# Patient Record
Sex: Female | Born: 1940 | Race: White | Hispanic: Yes | Marital: Married | Smoking: Never smoker
Health system: Southern US, Community
[De-identification: ages and names within clinical notes are randomized; demographics above are authoritative.]

## PROBLEM LIST (undated history)

## (undated) DIAGNOSIS — I4891 Unspecified atrial fibrillation: Secondary | ICD-10-CM

## (undated) DIAGNOSIS — I10 Essential (primary) hypertension: Secondary | ICD-10-CM

## (undated) DIAGNOSIS — E7800 Pure hypercholesterolemia, unspecified: Secondary | ICD-10-CM

## (undated) DIAGNOSIS — E119 Type 2 diabetes mellitus without complications: Secondary | ICD-10-CM

---

## 2007-12-06 ENCOUNTER — Emergency Department (HOSPITAL_BASED_OUTPATIENT_CLINIC_OR_DEPARTMENT_OTHER): Admission: EM | Admit: 2007-12-06 | Discharge: 2007-12-06 | Payer: Self-pay | Admitting: Emergency Medicine

## 2013-11-20 ENCOUNTER — Ambulatory Visit: Payer: Self-pay | Admitting: Gynecology

## 2016-08-23 ENCOUNTER — Encounter: Payer: Self-pay | Admitting: Gynecology

## 2017-02-07 ENCOUNTER — Encounter (HOSPITAL_BASED_OUTPATIENT_CLINIC_OR_DEPARTMENT_OTHER): Payer: Self-pay

## 2017-02-07 ENCOUNTER — Emergency Department (HOSPITAL_BASED_OUTPATIENT_CLINIC_OR_DEPARTMENT_OTHER)
Admission: EM | Admit: 2017-02-07 | Discharge: 2017-02-07 | Disposition: A | Discharge status: Home / Self Care | Payer: Medicare Other | Attending: Emergency Medicine | Admitting: Emergency Medicine

## 2017-02-07 ENCOUNTER — Emergency Department (HOSPITAL_BASED_OUTPATIENT_CLINIC_OR_DEPARTMENT_OTHER): Payer: Medicare Other

## 2017-02-07 DIAGNOSIS — I10 Essential (primary) hypertension: Secondary | ICD-10-CM | POA: Diagnosis not present

## 2017-02-07 DIAGNOSIS — E7800 Pure hypercholesterolemia, unspecified: Secondary | ICD-10-CM | POA: Insufficient documentation

## 2017-02-07 DIAGNOSIS — N3000 Acute cystitis without hematuria: Secondary | ICD-10-CM | POA: Insufficient documentation

## 2017-02-07 DIAGNOSIS — R51 Headache: Secondary | ICD-10-CM | POA: Diagnosis present

## 2017-02-07 DIAGNOSIS — E119 Type 2 diabetes mellitus without complications: Secondary | ICD-10-CM | POA: Diagnosis not present

## 2017-02-07 DIAGNOSIS — R519 Headache, unspecified: Secondary | ICD-10-CM

## 2017-02-07 HISTORY — DX: Type 2 diabetes mellitus without complications: E11.9

## 2017-02-07 HISTORY — DX: Essential (primary) hypertension: I10

## 2017-02-07 HISTORY — DX: Pure hypercholesterolemia, unspecified: E78.00

## 2017-02-07 LAB — CBC WITH DIFFERENTIAL/PLATELET
Basophils Absolute: 0 10*3/uL (ref 0.0–0.1)
Basophils Relative: 1 %
EOS ABS: 0.3 10*3/uL (ref 0.0–0.7)
EOS PCT: 4 %
HCT: 39.3 % (ref 36.0–46.0)
Hemoglobin: 13.4 g/dL (ref 12.0–15.0)
LYMPHS ABS: 2.1 10*3/uL (ref 0.7–4.0)
Lymphocytes Relative: 27 %
MCH: 28.4 pg (ref 26.0–34.0)
MCHC: 34.1 g/dL (ref 30.0–36.0)
MCV: 83.3 fL (ref 78.0–100.0)
MONOS PCT: 6 %
Monocytes Absolute: 0.5 10*3/uL (ref 0.1–1.0)
Neutro Abs: 5.1 10*3/uL (ref 1.7–7.7)
Neutrophils Relative %: 62 %
PLATELETS: 351 10*3/uL (ref 150–400)
RBC: 4.72 MIL/uL (ref 3.87–5.11)
RDW: 12.8 % (ref 11.5–15.5)
WBC: 8.1 10*3/uL (ref 4.0–10.5)

## 2017-02-07 LAB — URINALYSIS, ROUTINE W REFLEX MICROSCOPIC
Bilirubin Urine: NEGATIVE
Glucose, UA: NEGATIVE mg/dL
Hgb urine dipstick: NEGATIVE
Ketones, ur: NEGATIVE mg/dL
NITRITE: NEGATIVE
PROTEIN: NEGATIVE mg/dL
SPECIFIC GRAVITY, URINE: 1.01 (ref 1.005–1.030)
pH: 6 (ref 5.0–8.0)

## 2017-02-07 LAB — COMPREHENSIVE METABOLIC PANEL
ALT: 29 U/L (ref 14–54)
ANION GAP: 10 (ref 5–15)
AST: 35 U/L (ref 15–41)
Albumin: 4.2 g/dL (ref 3.5–5.0)
Alkaline Phosphatase: 67 U/L (ref 38–126)
BUN: 13 mg/dL (ref 6–20)
CHLORIDE: 103 mmol/L (ref 101–111)
CO2: 23 mmol/L (ref 22–32)
Calcium: 9.5 mg/dL (ref 8.9–10.3)
Creatinine, Ser: 0.79 mg/dL (ref 0.44–1.00)
GFR calc non Af Amer: >60 mL/min (ref >60)
Glucose, Bld: 154 mg/dL — ABNORMAL HIGH (ref 65–99)
POTASSIUM: 3.9 mmol/L (ref 3.5–5.1)
SODIUM: 136 mmol/L (ref 135–145)
Total Bilirubin: 0.5 mg/dL (ref 0.3–1.2)
Total Protein: 7.6 g/dL (ref 6.5–8.1)

## 2017-02-07 LAB — URINALYSIS, MICROSCOPIC (REFLEX)

## 2017-02-07 LAB — TROPONIN I: Troponin I: <0.03 ng/mL (ref <0.03)

## 2017-02-07 MED ORDER — ACETAMINOPHEN 500 MG PO TABS
1000.0000 mg | ORAL_TABLET | Freq: Once | ORAL | Status: AC
  Administered 2017-02-07: 1000 mg via ORAL
  Filled 2017-02-07: qty 2

## 2017-02-07 MED ORDER — ONDANSETRON HCL 4 MG/2ML IJ SOLN
4.0000 mg | Freq: Once | INTRAMUSCULAR | Status: AC
  Administered 2017-02-07: 4 mg via INTRAVENOUS
  Filled 2017-02-07: qty 2

## 2017-02-07 MED ORDER — ONDANSETRON 4 MG PO TBDP: 4.0000 mg | ORAL_TABLET | Freq: Three times a day (TID) | ORAL | 0 refills | Status: DC | PRN

## 2017-02-07 MED ORDER — SODIUM CHLORIDE 0.9 % IV BOLUS (SEPSIS)
500.0000 mL | Freq: Once | INTRAVENOUS | Status: AC
  Administered 2017-02-07: 500 mL via INTRAVENOUS

## 2017-02-07 NOTE — ED Provider Notes (Signed)
TIME SEEN: 2:36 AM  CHIEF COMPLAINT: Headache, nausea  HPI: Patient is a 76 year old female with history of hypertension, diabetes, hyperlipidemia, atrial fibrillation on Eliquis who presents emergency department with diffuse frontal throbbing headache that started several days ago.  Has had associated nausea but no vomiting.  States she was seen by her primary care physician on October 25 and was found to be hypertensive.  Has a follow-up appointment today at 1 PM.  She is on amlodipine, losartan and sotalol and denies any changes in these medications.  She has been on these medications for "years".  Has not missed any doses.  She states she has had similar headaches before and that Tylenol normally helps these headaches.  She has not tried any Tylenol today.  She states she has had increased anxiety and stress recently.  Denies head injury.  No numbness, tingling or focal weakness.  No vision changes.  No chest pain or shortness of breath.  No abdominal pain.  No vomiting.  Has "little bit" of diarrhea.  Spanish interpreter used throughout this visit.  ROS: See HPI Constitutional: no fever  Eyes: no drainage  ENT: no runny nose   Cardiovascular:  no chest pain  Resp: no SOB  GI: no vomiting GU: no dysuria Integumentary: no rash  Allergy: no hives  Musculoskeletal: no leg swelling  Neurological: no slurred speech ROS otherwise negative  PAST MEDICAL HISTORY/PAST SURGICAL HISTORY:  Past Medical History:  Diagnosis Date  . Diabetes mellitus without complication (HCC)   . High cholesterol   . Hypertension     MEDICATIONS:  Prior to Admission medications   Not on File    ALLERGIES:  Allergies  Allergen Reactions  . Aspirin   . Lisinopril     SOCIAL HISTORY:  Social History  Substance Use Topics  . Smoking status: Never Smoker  . Smokeless tobacco: Never Used  . Alcohol use No    FAMILY HISTORY: No family history on file.  EXAM: BP (!) 171/75 (BP Location: Right Arm)    Pulse 81   Temp 98.1 F (36.7 C) (Oral)   Ht 5' (1.524 m)   Wt 61.2 kg (135 lb)   SpO2 98%   BMI 26.37 kg/m  CONSTITUTIONAL: Alert and oriented and responds appropriately to questions. Well-appearing; well-nourished HEAD: Normocephalic EYES: Conjunctivae clear, pupils appear equal, EOMI ENT: normal nose; moist mucous membranes NECK: Supple, no meningismus, no nuchal rigidity, no LAD  CARD: RRR; S1 and S2 appreciated; no murmurs, no clicks, no rubs, no gallops RESP: Normal chest excursion without splinting or tachypnea; breath sounds clear and equal bilaterally; no wheezes, no rhonchi, no rales, no hypoxia or respiratory distress, speaking full sentences ABD/GI: Normal bowel sounds; non-distended; soft, non-tender, no rebound, no guarding, no peritoneal signs, no hepatosplenomegaly BACK:  The back appears normal and is non-tender to palpation, there is no CVA tenderness EXT: Normal ROM in all joints; non-tender to palpation; no edema; normal capillary refill; no cyanosis, no calf tenderness or swelling    SKIN: Normal color for age and race; warm; no rash NEURO: Moves all extremities equally; Strength 5/5 in all four extremities.  Normal sensation diffusely.  CN 2-12 grossly intact. Normal speech.  Normal gait. PSYCH: The patient's mood and manner are appropriate. Grooming and personal hygiene are appropriate.  MEDICAL DECISION MAKING: Patient here with complaints of headache and nausea.  She is also hypertensive but states that she was aware of this and has follow-up for this at 1 PM today  with her primary care doctor.  She states that she began feeling anxious today and then her blood pressure went up blood pressure normally is in the 140s/70s.  Check her blood pressure every day.  No focal neurologic deficits.  No severe, sudden onset headache.  Has had similar headaches in the past.  Reports Tylenol has helped before.  Will give dose of Tylenol here as well as small amount of IV fluids  and IV Zofran.  Abdominal exam is benign.  She does report that she was told by her primary care doctor that she had a urinary tract infection but has not started the antibiotics yet.  Discussed with her that this could be the reason she is feeling poorly and feeling nauseated.  No flank pain.  Will check labs, urine, head CT today.  EKG shows no ischemic abnormality.  ED PROGRESS: Patient's labs are unremarkable.  Normal kidney function.  Urine shows no proteinuria or hematuria.  She does appear to have a urinary tract infection.  Urine culture has been sent.  She will pick up her antibiotics in the morning that was prescribed by her PCP.  Troponin negative.  Patient's blood pressure has improved.  HA and nausea have resolved.  Head CT shows no acute abnormality.  I feel she is safe for discharge.  She has PCP follow-up at 1 PM today.  Recommended Tylenol as needed at home for her headache.  Will discharge with Zofran.   At this time, I do not feel there is any life-threatening condition present. I have reviewed and discussed all results (EKG, imaging, lab, urine as appropriate) and exam findings with patient/family. I have reviewed nursing notes and appropriate previous records.  I feel the patient is safe to be discharged home without further emergent workup and can continue workup as an outpatient as needed. Discussed usual and customary return precautions. Patient/family verbalize understanding and are comfortable with this plan.  Outpatient follow-up has been provided if needed. All questions have been answered.    EKG Interpretation  Date/Time:  Wednesday February 07 2017 03:15:00 EDT Ventricular Rate:  71 PR Interval:    QRS Duration: 104 QT Interval:  459 QTC Calculation: 499 R Axis:   37 Text Interpretation:  Sinus rhythm Atrial premature complex Low voltage, precordial leads Abnormal R-wave progression, early transition Borderline prolonged QT interval No old tracing to compare Confirmed  by Bane Hagy, Baxter Hire [phone]) on 02/07/2017 3:25:53 AM          Satori Krabill, Layla Maw, DO 02/07/17 [phone]

## 2017-02-07 NOTE — ED Triage Notes (Signed)
Pt reports high BP x 3 days. Pt complains of nausea, lightheadedness and HA.

## 2017-02-07 NOTE — Discharge Instructions (Addendum)
You may take Tylenol 1000 mg every 6 hours as needed for pain.  Please pick up your antibiotics in the morning and start taking these for your urinary tract infection.  Your labs, EKG and head CT were normal today.  Your blood pressure improved as your headache and nausea resolved.  Please continue your medications as prescribed.  Please follow-up with your primary care physician as scheduled at 1 PM today.

## 2017-02-08 LAB — URINE CULTURE

## 2017-02-09 ENCOUNTER — Telehealth: Payer: Self-pay | Admitting: *Deleted

## 2017-02-09 NOTE — Telephone Encounter (Signed)
Post ED Visit - Positive Culture Follow-up  Culture report reviewed by antimicrobial stewardship pharmacist:  [x]  Savannah Kramer, Pharm.D. []  Savannah Kramer, Pharm.D., BCPS AQ-ID []  Savannah Kramer, Pharm.D., BCPS []  Savannah Kramer, Pharm.D., BCPS []  [name], [address] BoulevardPharm.D., BCPS, AAHIVP []  [name], Pharm.D., BCPS, AAHIVP []  [name], PharmD, BCPS []  Savannah Kramer, PharmD, BCPS []  [name], PharmD, BCPS  Positive urine culture Treated with Nitrofurantoin by PCP, organism sensitive to the same and no further patient follow-up is required at this time.  Savannah Kramer, [facility] Kramer [facility] 02/09/2017, 9:59 AM

## 2018-05-16 IMAGING — CT CT HEAD W/O CM
3 series · 14 of 47 positions shown, 16 images · non-contrast
Comparison: None.

CLINICAL DATA: Headache, acute. Normal neurologic exam.
Hypertension tonight. Lightheaded.

EXAM:
CT HEAD WITHOUT CONTRAST
TECHNIQUE: Contiguous axial images were obtained from the base of the skull
through the vertex without intravenous contrast.

[Series 2: head wo · axial · 0.41mm/px · z∈[-179,-54]mm · 8 of 30 slices shown, 10 images]
[im 3/30  brain]
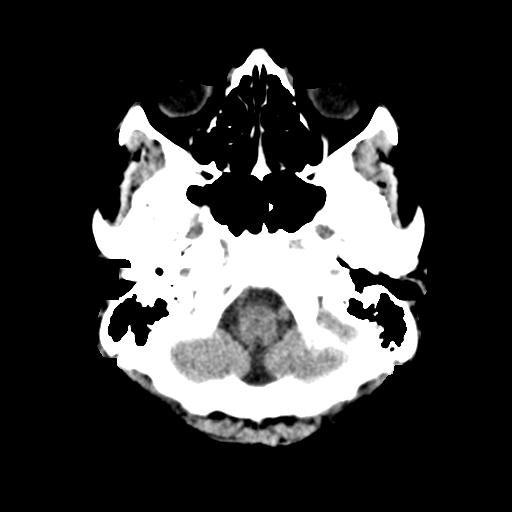
[im 3/30  bone]
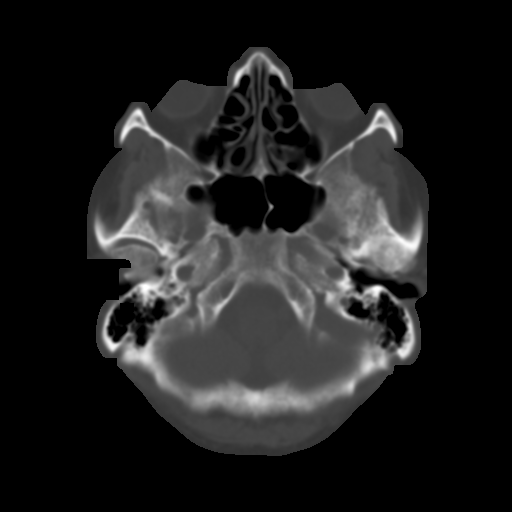
[im 7/30  brain]
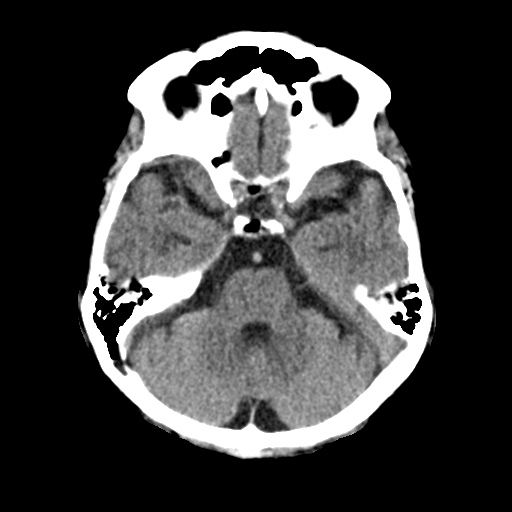
[im 10/30  brain]
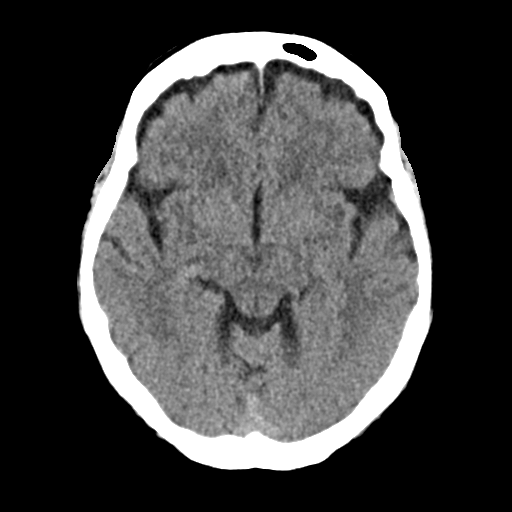
[im 14/30  brain]
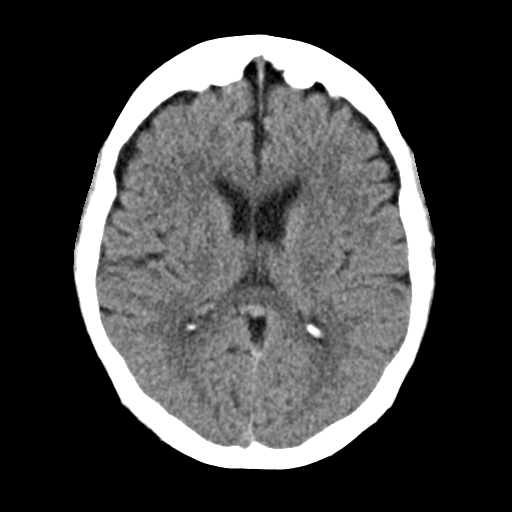
[im 17/30  brain]
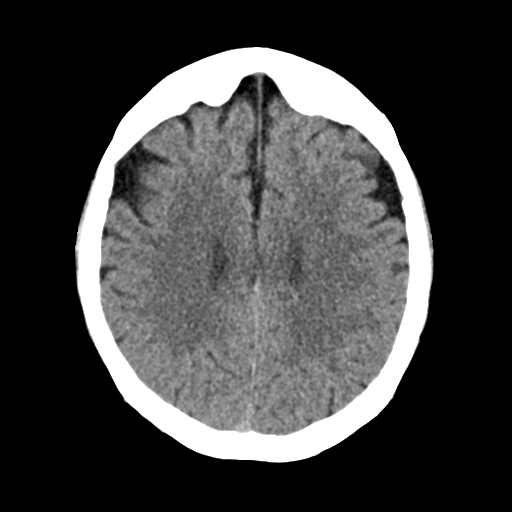
[im 17/30  bone]
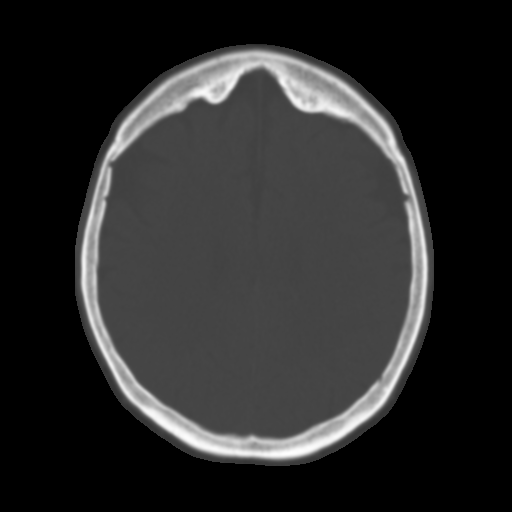
[im 21/30  brain]
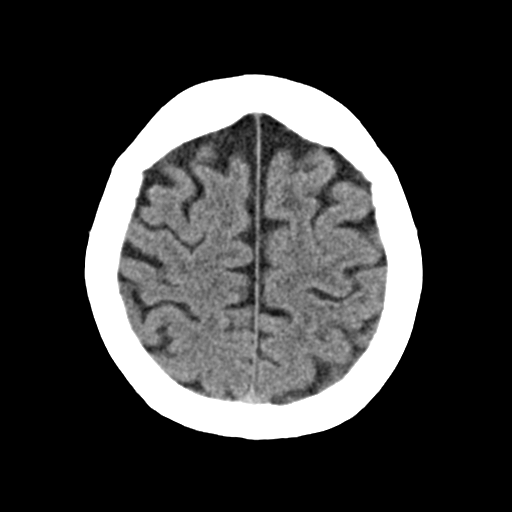
[im 24/30  brain]
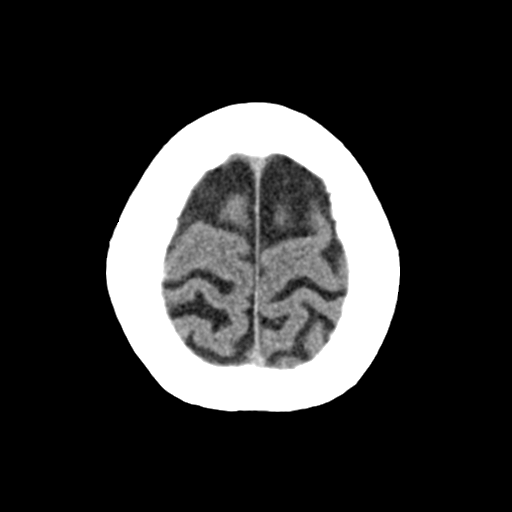
[im 28/30  brain]
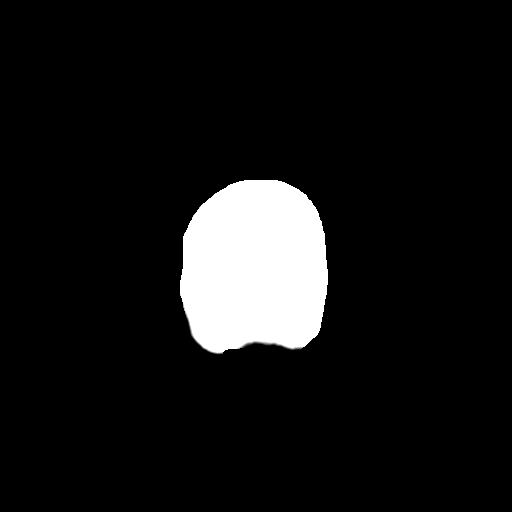

[Series 4: coronal soft · coronal · 0.31mm/px · 3 of 63 slices shown]
[im 21/63  brain]
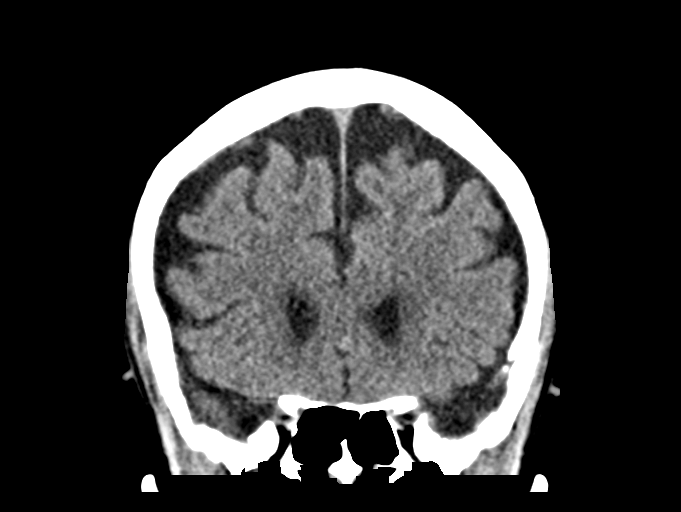
[im 28/63  brain]
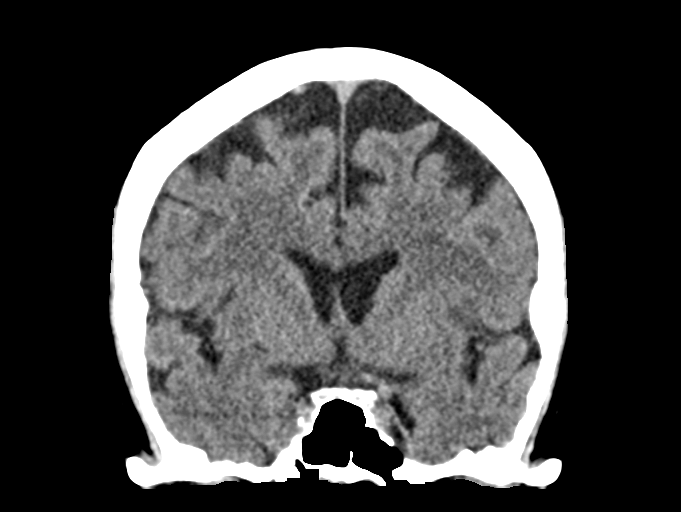
[im 35/63  brain]
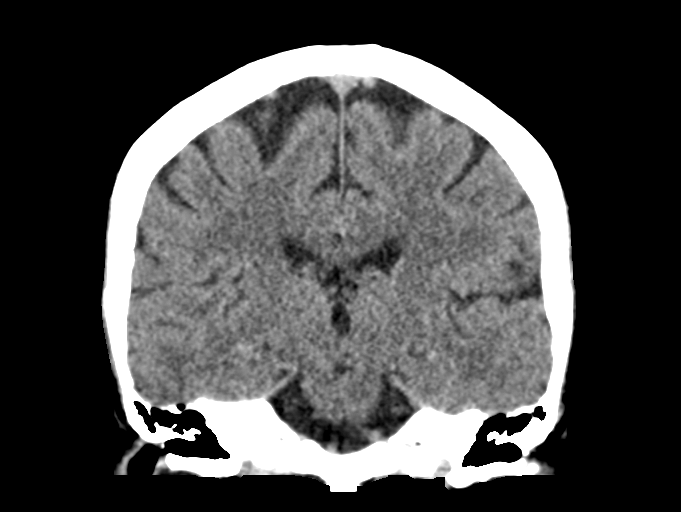

[Series 5: sag soft · sagittal · 0.31mm/px · 3 of 51 slices shown]
[im 17/51  brain]
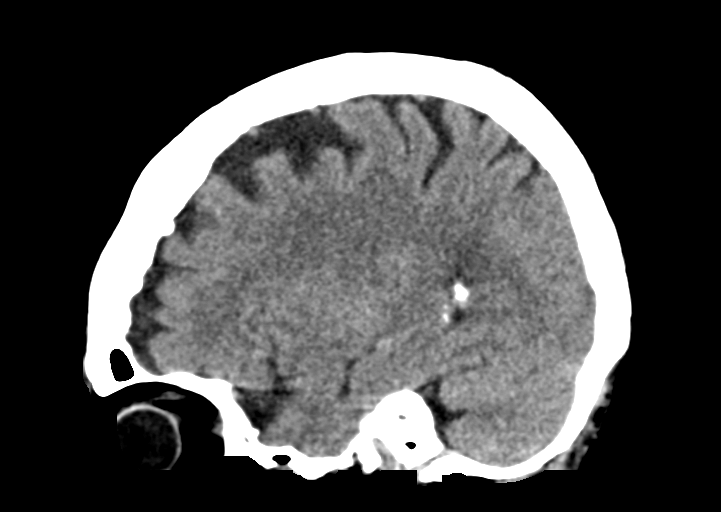
[im 26/51  brain]
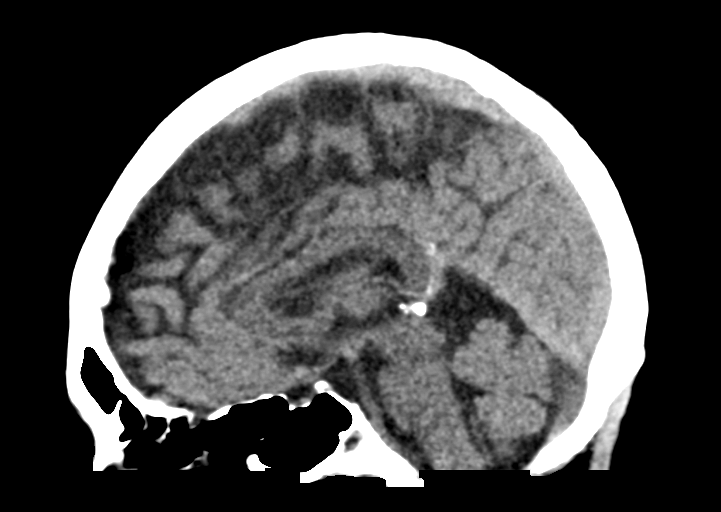
[im 34/51  brain]
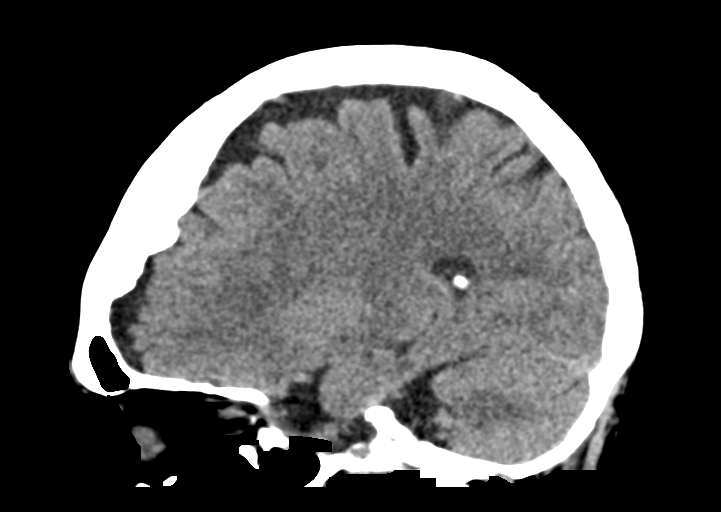

[14 of 47 positions shown; findings below may reference images not displayed]

FINDINGS: Brain: Generalized cerebellar and cerebral atrophy. Mild chronic
small vessel ischemia. No intracranial hemorrhage, mass effect, or
midline shift. No hydrocephalus. The basilar cisterns are patent. No
evidence of territorial infarct or acute ischemia. No extra-axial or
intracranial fluid collection.

Vascular: No hyperdense vessel or unexpected calcification.

Skull: No fracture or focal lesion.

Sinuses/Orbits: Paranasal sinuses and mastoid air cells are clear.
The visualized orbits are unremarkable. Probable bilateral cataract
resection.

Other: None.
IMPRESSION: 1.  No acute intracranial abnormality.
2. Generalized atrophy with mild chronic small vessel ischemia.

## 2022-08-09 ENCOUNTER — Ambulatory Visit (HOSPITAL_COMMUNITY)
Admission: EM | Admit: 2022-08-09 | Discharge: 2022-08-09 | Disposition: A | Discharge status: Home / Self Care | Payer: Medicare HMO | Attending: Emergency Medicine | Admitting: Emergency Medicine

## 2022-08-09 ENCOUNTER — Encounter (HOSPITAL_COMMUNITY): Payer: Self-pay

## 2022-08-09 ENCOUNTER — Ambulatory Visit (INDEPENDENT_AMBULATORY_CARE_PROVIDER_SITE_OTHER): Payer: Medicare HMO

## 2022-08-09 DIAGNOSIS — J40 Bronchitis, not specified as acute or chronic: Secondary | ICD-10-CM

## 2022-08-09 DIAGNOSIS — R051 Acute cough: Secondary | ICD-10-CM

## 2022-08-09 HISTORY — DX: Unspecified atrial fibrillation: I48.91

## 2022-08-09 MED ORDER — PSEUDOEPHEDRINE-GUAIFENESIN 30-100 MG/5ML PO SYRP: 5.0000 mL | ORAL_SOLUTION | ORAL | 0 refills | Status: AC | PRN

## 2022-08-09 MED ORDER — ALBUTEROL SULFATE HFA 108 (90 BASE) MCG/ACT IN AERS: 1.0000 | INHALATION_SPRAY | Freq: Four times a day (QID) | RESPIRATORY_TRACT | 0 refills | Status: DC | PRN

## 2022-08-09 NOTE — ED Triage Notes (Signed)
Pt states had COVID 2wks ago with cough and chest tightness. States sx's have worsen in the past 3 days. States having some SOB when the tightness is bad.

## 2022-08-09 NOTE — ED Provider Notes (Signed)
[facility]    CSN: [id number] Arrival date & time: 08/09/22  1847      History   Chief Complaint Chief Complaint  Patient presents with   Cough    HPI Savannah Kramer is a 82 y.o. female.   Patient brought to clinic by family for complaints of continued cough, chest tightness and shortness of breath after initial COVID-19 infection 2 weeks ago.  She was going to be placed on the antiviral, however, this was too expensive.  She reports slight generalized weakness and overall diminished appetite.  Denies any wheezing.  Denies fevers.  Denies nausea, vomiting or diarrhea.  She is diabetic with hx of HLD, HTN and a-fib on eliquis.     The history is provided by the patient, medical records, a caregiver and a relative.  Cough Associated symptoms: shortness of breath   Associated symptoms: no chest pain, no chills, no sore throat and no wheezing     Past Medical History:  Diagnosis Date   A-fib (HCC)    Diabetes mellitus without complication (HCC)    High cholesterol    Hypertension     There are no problems to display for this patient.   History reviewed. No pertinent surgical history.  OB History   No obstetric history on file.      Home Medications    Prior to Admission medications   Medication Sig Start Date End Date Taking? Authorizing Provider  albuterol (VENTOLIN HFA) 108 (90 Base) MCG/ACT inhaler Inhale 1-2 puffs into the lungs every 6 (six) hours as needed for wheezing or shortness of breath. 08/09/22  Yes Rinaldo Ratel, [place] N, FNP  pseudoephedrine-guaifenesin (TUSSIN PE) 30-100 MG/5ML SYRP Take 5 mLs by mouth every 4 (four) hours as needed for up to 5 days. 08/09/22 08/14/22 Yes Andre Gallego, [place] N, FNP  amLODipine-atorvastatin (CADUET) 5-10 MG tablet Take 1 tablet by mouth daily.    [provider]  apixaban (ELIQUIS) 5 MG TABS tablet Take 5 mg by mouth 2 (two) times daily.    [provider]  calcium carbonate (OSCAL) 1500 (600 Ca) MG  TABS tablet Take by mouth 2 (two) times daily with a meal.    [provider]  cholecalciferol (VITAMIN D) 1000 units tablet Take 1,000 Units by mouth daily.    [provider]  metFORMIN (GLUCOPHAGE) 500 MG tablet Take by mouth 2 (two) times daily with a meal.    [provider]  omeprazole (PRILOSEC OTC) 20 MG tablet Take 20 mg by mouth daily.    [provider]  ondansetron (ZOFRAN ODT) 4 MG disintegrating tablet Take 1 tablet (4 mg total) by mouth every 8 (eight) hours as needed for nausea or vomiting. 02/07/17   Ward, Layla Maw, DO  pravastatin (PRAVACHOL) 80 MG tablet Take 80 mg by mouth daily.    [provider]  sotalol (BETAPACE) 80 MG tablet Take 80 mg by mouth 2 (two) times daily.    [provider]    Family History History reviewed. No pertinent family history.  Social History Social History   Tobacco Use   Smoking status: Never   Smokeless tobacco: Never  Substance Use Topics   Alcohol use: No   Drug use: No     Allergies   Aspirin and Lisinopril   Review of Systems Review of Systems  Constitutional:  Positive for appetite change. Negative for chills.  HENT:  Negative for sore throat.   Respiratory:  Positive for cough and shortness  of breath. Negative for wheezing.   Cardiovascular:  Negative for chest pain.  Gastrointestinal:  Negative for abdominal pain.  Genitourinary:  Negative for dysuria.  Musculoskeletal:  Negative for back pain.  Neurological:  Positive for weakness.     Physical Exam Triage Vital Signs ED Triage Vitals [08/09/22 1925]  Enc Vitals Group     BP (!) 172/76     Pulse Rate 80     Resp 18     Temp 98.3 F (36.8 C)     Temp Source Oral     SpO2 97 %     Weight      Height      Head Circumference      Peak Flow      Pain Score 0     Pain Loc      Pain Edu?      Excl. in GC?    No data found.  Updated Vital Signs BP (!) 172/76 (BP Location: Left Arm)   Pulse 80    Temp 98.3 F (36.8 C) (Oral)   Resp 18   SpO2 97%   Visual Acuity Right Eye Distance:   Left Eye Distance:   Bilateral Distance:    Right Eye Near:   Left Eye Near:    Bilateral Near:     Physical Exam Vitals and nursing note reviewed.  Constitutional:      Appearance: Normal appearance.  HENT:     Head: Normocephalic and atraumatic.     Right Ear: External ear normal.     Left Ear: External ear normal.     Nose: Nose normal.     Mouth/Throat:     Mouth: Mucous membranes are dry.  Eyes:     General: No scleral icterus.    Conjunctiva/sclera: Conjunctivae normal.  Cardiovascular:     Rate and Rhythm: Normal rate and regular rhythm.     Heart sounds: Normal heart sounds. No murmur heard. Pulmonary:     Effort: Pulmonary effort is normal. No respiratory distress.     Breath sounds: Normal breath sounds.  Musculoskeletal:        General: No swelling. Normal range of motion.  Skin:    General: Skin is warm and dry.     Capillary Refill: Capillary refill takes less than 2 seconds.  Neurological:     General: No focal deficit present.     Mental Status: She is alert and oriented to person, place, and time.  Psychiatric:        Mood and Affect: Mood normal.        Behavior: Behavior normal.      UC Treatments / Results  Labs (all labs ordered are listed, but only abnormal results are displayed) Labs Reviewed - No data to display  EKG   Radiology DG Chest 2 View  Result Date: 08/09/2022 CLINICAL DATA:  Cough.  Shortness of breath. EXAM: CHEST - 2 VIEW COMPARISON:  08/07/2017 FINDINGS: The heart size and mediastinal contours are within normal limits. Aortic atherosclerotic calcification incidentally noted. Both lungs are clear. The visualized skeletal structures are unremarkable. IMPRESSION: No active cardiopulmonary disease. Electronically Signed   By: Danae Orleans M.D.   On: 08/09/2022 20:10    Procedures Procedures (including critical care time)  Medications  Ordered in UC Medications - No data to display  Initial Impression / Assessment and Plan / UC Course  I have reviewed the triage vital signs and the nursing notes.  Pertinent labs & imaging  results that were available during my care of the patient were reviewed by me and considered in my medical decision making (see chart for details).  Vitals and triage reviewed, patient is hemodynamically stable.  Lungs vesicular posteriorly.  Chest x-ray obtained due to age and prolonged cough post Covid-19 infection. CXR negative for acute infection or infiltrate.  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  No acute changes compared to previous.  Discussed that her cough is likely post-COVID, trial albuterol inhaler and cough syrup as needed.  Given strict emergency room return precautions, patient family verbalized understanding, no questions at this time.      Final Clinical Impressions(s) / UC Diagnoses   Final diagnoses:  Bronchitis  Acute cough     Discharge Instructions      Your chest x-ray was negative for pneumonia or an acute infection.  I believe you have a post-COVID cough.  You can use the albuterol inhaler every 6 hours as needed for wheezing or shortness of breath.  You can use the cough syrup 4 hours as needed for cough.  Please ensure you are staying well-hydrated with at least 64 ounces of water or an electrolyte solution like Pedialyte daily.  Please ensure you are eating frequent snacks or meals high in protein, you can consider adding a protein shake like boost to your diet.  Please return to clinic, seek immediate care or follow-up with your primary care if you have no improvement in symptoms over the next few weeks, you develop worsening fatigue, fever, shortness of breath or chest pains.      ED Prescriptions     Medication Sig Dispense Auth. Provider   albuterol (VENTOLIN HFA) 108 (90 Base) MCG/ACT inhaler  Inhale 1-2 puffs into the lungs every 6 (six) hours as needed for wheezing or shortness of breath. 18 g Rinaldo Ratel, [place] N, [place]   pseudoephedrine-guaifenesin (TUSSIN PE) 30-100 MG/5ML SYRP Take 5 mLs by mouth every 4 (four) hours as needed for up to 5 days. 280 mL Corianne Buccellato, [place] N, FNP      PDMP not reviewed this encounter.   Averyanna Sax, [place] N, [place] 08/09/22 2027

## 2022-08-09 NOTE — Discharge Instructions (Addendum)
Your chest x-ray was negative for pneumonia or an acute infection.  I believe you have a post-COVID cough.  You can use the albuterol inhaler every 6 hours as needed for wheezing or shortness of breath.  You can use the cough syrup 4 hours as needed for cough.  Please ensure you are staying well-hydrated with at least 64 ounces of water or an electrolyte solution like Pedialyte daily.  Please ensure you are eating frequent snacks or meals high in protein, you can consider adding a protein shake like boost to your diet.  Please return to clinic, seek immediate care or follow-up with your primary care if you have no improvement in symptoms over the next few weeks, you develop worsening fatigue, fever, shortness of breath or chest pains.

## 2023-03-23 ENCOUNTER — Ambulatory Visit
Admission: EM | Admit: 2023-03-23 | Discharge: 2023-03-23 | Disposition: A | Discharge status: Home / Self Care | Payer: Medicare HMO | Attending: Nurse Practitioner | Admitting: Nurse Practitioner

## 2023-03-23 ENCOUNTER — Encounter: Payer: Self-pay | Admitting: Emergency Medicine

## 2023-03-23 ENCOUNTER — Ambulatory Visit (INDEPENDENT_AMBULATORY_CARE_PROVIDER_SITE_OTHER): Payer: Medicare HMO

## 2023-03-23 DIAGNOSIS — S52501A Unspecified fracture of the lower end of right radius, initial encounter for closed fracture: Secondary | ICD-10-CM | POA: Diagnosis not present

## 2023-03-23 DIAGNOSIS — S6991XA Unspecified injury of right wrist, hand and finger(s), initial encounter: Secondary | ICD-10-CM | POA: Diagnosis not present

## 2023-03-23 NOTE — ED Provider Notes (Signed)
[facility]    CSN: [id number] Arrival date & time: 03/23/23  1237      History   Chief Complaint Chief Complaint  Patient presents with   Arm Injury    HPI Savannah Kramer is a 82 y.o. female.   HPI   She is in today with her daughter for evaluation of her right wrist.  She reports that she was getting up off of the sofa and tripped and fell on her right hand.  She tried to brace the fall with her palm.  She is having pain and swelling. Past Medical History:  Diagnosis Date   A-fib (HCC)    Diabetes mellitus without complication (HCC)    High cholesterol    Hypertension     There are no active problems to display for this patient.   History reviewed. No pertinent surgical history.  OB History   No obstetric history on file.      Home Medications    Prior to Admission medications   Medication Sig Start Date End Date Taking? Authorizing Provider  amLODipine-atorvastatin (CADUET) 5-10 MG tablet Take 1 tablet by mouth daily.    [provider]  apixaban (ELIQUIS) 5 MG TABS tablet Take 5 mg by mouth 2 (two) times daily.    [provider]  calcium carbonate (OSCAL) 1500 (600 Ca) MG TABS tablet Take by mouth 2 (two) times daily with a meal.    [provider]  cholecalciferol (VITAMIN D) 1000 units tablet Take 1,000 Units by mouth daily.    [provider]  metFORMIN (GLUCOPHAGE) 500 MG tablet Take by mouth 2 (two) times daily with a meal.    [provider]  omeprazole (PRILOSEC OTC) 20 MG tablet Take 20 mg by mouth daily.    [provider]  pravastatin (PRAVACHOL) 80 MG tablet Take 80 mg by mouth daily.    [provider]  sotalol (BETAPACE) 80 MG tablet Take 80 mg by mouth 2 (two) times daily.    [provider]    Family History Family History  Problem Relation Age of Onset   Healthy Mother    Healthy Father     Social History Social History   Tobacco Use   Smoking  status: Never   Smokeless tobacco: Never  Substance Use Topics   Alcohol use: No   Drug use: No     Allergies   Aspirin, Lisinopril, and Prednisone   Review of Systems Review of Systems   Physical Exam Triage Vital Signs ED Triage Vitals  Encounter Vitals Group     BP 03/23/23 1250 (!) 145/73     Systolic BP Percentile --      Diastolic BP Percentile --      Pulse Rate 03/23/23 1250 76     Resp 03/23/23 1250 16     Temp 03/23/23 1250 98.3 F (36.8 C)     Temp Source 03/23/23 1250 Oral     SpO2 03/23/23 1250 96 %     Weight --      Height --      Head Circumference --      Peak Flow --      Pain Score 03/23/23 1247 8     Pain Loc --      Pain Education --      Exclude from Growth Chart --    No data found.  Updated Vital Signs BP (!) 145/73 (BP Location: Right Arm)   Pulse 76  Temp 98.3 F (36.8 C) (Oral)   Resp 16   SpO2 96%   Visual Acuity Right Eye Distance:   Left Eye Distance:   Bilateral Distance:    Right Eye Near:   Left Eye Near:    Bilateral Near:     Physical Exam Constitutional:      General: She is not in acute distress.    Appearance: She is normal weight.  HENT:     Head: Normocephalic and atraumatic.  Cardiovascular:     Rate and Rhythm: Normal rate.  Pulmonary:     Effort: Pulmonary effort is normal.  Musculoskeletal:     Right wrist: Swelling, deformity and bony tenderness present. Decreased range of motion.  Neurological:     Mental Status: She is alert.      UC Treatments / Results  Labs (all labs ordered are listed, but only abnormal results are displayed) Labs Reviewed - No data to display  EKG   Radiology No results found.  Procedures Procedures (including critical care time)  Medications Ordered in UC Medications - No data to display  Initial Impression / Assessment and Plan / UC Course  I have reviewed the triage vital signs and the nursing notes.  Pertinent labs & imaging results that were  available during my care of the patient were reviewed by me and considered in my medical decision making (see chart for details).     Right writ pain Final Clinical Impressions(s) / UC Diagnoses   Final diagnoses:  Injury of right wrist, initial encounter  Closed fracture of distal end of right radius, unspecified fracture morphology, initial encounter     Discharge Instructions      You have been diagnosed with closed fracture of the right wrist.  You have been provided with a brace.  The recommendation is for you to follow-up with [facility] as soon as possible for further evaluation and additional treatment recommendations.    ED Prescriptions   None    PDMP not reviewed this encounter.   Thad Ranger [place], [place] 03/23/23 [phone]

## 2023-03-23 NOTE — ED Triage Notes (Signed)
Patient presents to [facility] for evaluation of right wrist pain after tripping on her shoes getting up from the couch and falling on to her right arm (caught herself with her palm).  Pain to right forearm.

## 2023-03-23 NOTE — Discharge Instructions (Addendum)
You have been diagnosed with closed fracture of the right wrist.  You have been provided with a brace.  The recommendation is for you to follow-up with [facility] as soon as possible for further evaluation and additional treatment recommendations.

## 2023-09-06 ENCOUNTER — Encounter (HOSPITAL_BASED_OUTPATIENT_CLINIC_OR_DEPARTMENT_OTHER): Payer: Self-pay | Admitting: Emergency Medicine

## 2023-09-06 ENCOUNTER — Emergency Department (HOSPITAL_BASED_OUTPATIENT_CLINIC_OR_DEPARTMENT_OTHER)

## 2023-09-06 ENCOUNTER — Other Ambulatory Visit: Payer: Self-pay

## 2023-09-06 ENCOUNTER — Observation Stay (HOSPITAL_BASED_OUTPATIENT_CLINIC_OR_DEPARTMENT_OTHER)
Admission: EM | Admit: 2023-09-06 | Discharge: 2023-09-07 | Disposition: A | Discharge status: Home / Self Care | Attending: Internal Medicine | Admitting: Internal Medicine

## 2023-09-06 ENCOUNTER — Ambulatory Visit
Admission: EM | Admit: 2023-09-06 | Discharge: 2023-09-06 | Disposition: A | Discharge status: Home / Self Care | Attending: Nurse Practitioner | Admitting: Nurse Practitioner

## 2023-09-06 DIAGNOSIS — R2689 Other abnormalities of gait and mobility: Secondary | ICD-10-CM | POA: Insufficient documentation

## 2023-09-06 DIAGNOSIS — I482 Chronic atrial fibrillation, unspecified: Secondary | ICD-10-CM | POA: Diagnosis not present

## 2023-09-06 DIAGNOSIS — D500 Iron deficiency anemia secondary to blood loss (chronic): Secondary | ICD-10-CM | POA: Insufficient documentation

## 2023-09-06 DIAGNOSIS — R079 Chest pain, unspecified: Secondary | ICD-10-CM

## 2023-09-06 DIAGNOSIS — E871 Hypo-osmolality and hyponatremia: Secondary | ICD-10-CM | POA: Insufficient documentation

## 2023-09-06 DIAGNOSIS — Z7984 Long term (current) use of oral hypoglycemic drugs: Secondary | ICD-10-CM | POA: Diagnosis not present

## 2023-09-06 DIAGNOSIS — Z8551 Personal history of malignant neoplasm of bladder: Secondary | ICD-10-CM | POA: Diagnosis not present

## 2023-09-06 DIAGNOSIS — E119 Type 2 diabetes mellitus without complications: Secondary | ICD-10-CM | POA: Insufficient documentation

## 2023-09-06 DIAGNOSIS — R0602 Shortness of breath: Secondary | ICD-10-CM | POA: Insufficient documentation

## 2023-09-06 DIAGNOSIS — Z7982 Long term (current) use of aspirin: Secondary | ICD-10-CM | POA: Insufficient documentation

## 2023-09-06 DIAGNOSIS — Z79899 Other long term (current) drug therapy: Secondary | ICD-10-CM | POA: Diagnosis not present

## 2023-09-06 DIAGNOSIS — R911 Solitary pulmonary nodule: Secondary | ICD-10-CM | POA: Insufficient documentation

## 2023-09-06 DIAGNOSIS — I10 Essential (primary) hypertension: Secondary | ICD-10-CM

## 2023-09-06 DIAGNOSIS — Z9181 History of falling: Secondary | ICD-10-CM | POA: Insufficient documentation

## 2023-09-06 DIAGNOSIS — R059 Cough, unspecified: Secondary | ICD-10-CM | POA: Diagnosis not present

## 2023-09-06 DIAGNOSIS — Z7902 Long term (current) use of antithrombotics/antiplatelets: Secondary | ICD-10-CM | POA: Insufficient documentation

## 2023-09-06 DIAGNOSIS — E785 Hyperlipidemia, unspecified: Secondary | ICD-10-CM | POA: Insufficient documentation

## 2023-09-06 DIAGNOSIS — Z7901 Long term (current) use of anticoagulants: Secondary | ICD-10-CM | POA: Diagnosis not present

## 2023-09-06 DIAGNOSIS — I480 Paroxysmal atrial fibrillation: Secondary | ICD-10-CM | POA: Diagnosis not present

## 2023-09-06 DIAGNOSIS — K219 Gastro-esophageal reflux disease without esophagitis: Secondary | ICD-10-CM | POA: Diagnosis not present

## 2023-09-06 DIAGNOSIS — I441 Atrioventricular block, second degree: Principal | ICD-10-CM | POA: Insufficient documentation

## 2023-09-06 DIAGNOSIS — I499 Cardiac arrhythmia, unspecified: Principal | ICD-10-CM | POA: Insufficient documentation

## 2023-09-06 DIAGNOSIS — E663 Overweight: Secondary | ICD-10-CM | POA: Insufficient documentation

## 2023-09-06 DIAGNOSIS — R0789 Other chest pain: Secondary | ICD-10-CM | POA: Diagnosis present

## 2023-09-06 LAB — CBC
HCT: 33.2 % — ABNORMAL LOW (ref 36.0–46.0)
Hemoglobin: 11.3 g/dL — ABNORMAL LOW (ref 12.0–15.0)
MCH: 27.6 pg (ref 26.0–34.0)
MCHC: 34 g/dL (ref 30.0–36.0)
MCV: 81.2 fL (ref 80.0–100.0)
Platelets: 363 10*3/uL (ref 150–400)
RBC: 4.09 MIL/uL (ref 3.87–5.11)
RDW: 13.1 % (ref 11.5–15.5)
WBC: 7.5 10*3/uL (ref 4.0–10.5)
nRBC: 0 % (ref 0.0–0.2)

## 2023-09-06 LAB — RESP PANEL BY RT-PCR (RSV, FLU A&B, COVID)  RVPGX2
Influenza A by PCR: NEGATIVE
Influenza B by PCR: NEGATIVE
Resp Syncytial Virus by PCR: NEGATIVE
SARS Coronavirus 2 by RT PCR: NEGATIVE

## 2023-09-06 LAB — BASIC METABOLIC PANEL WITH GFR
Anion gap: 17 — ABNORMAL HIGH (ref 5–15)
BUN: 15 mg/dL (ref 8–23)
CO2: 19 mmol/L — ABNORMAL LOW (ref 22–32)
Calcium: 9.2 mg/dL (ref 8.9–10.3)
Chloride: 97 mmol/L — ABNORMAL LOW (ref 98–111)
Creatinine, Ser: 0.88 mg/dL (ref 0.44–1.00)
GFR, Estimated: >60 mL/min (ref >60)
Glucose, Bld: 162 mg/dL — ABNORMAL HIGH (ref 70–99)
Potassium: 4.4 mmol/L (ref 3.5–5.1)
Sodium: 133 mmol/L — ABNORMAL LOW (ref 135–145)

## 2023-09-06 LAB — PRO BRAIN NATRIURETIC PEPTIDE: Pro Brain Natriuretic Peptide: 84.3 pg/mL (ref <300.0)

## 2023-09-06 LAB — TROPONIN T, HIGH SENSITIVITY
Troponin T High Sensitivity: <15 ng/L (ref <19)
Troponin T High Sensitivity: <15 ng/L (ref <19)

## 2023-09-06 MED ORDER — IOHEXOL 350 MG/ML SOLN
75.0000 mL | Freq: Once | INTRAVENOUS | Status: AC | PRN
  Administered 2023-09-06: 75 mL via INTRAVENOUS

## 2023-09-06 NOTE — Discharge Instructions (Addendum)
 You were evaluated today for chest discomfort and shortness of breath that has worsened over the past few days. Given your history of atrial fibrillation, hypertension, and recent chemotherapy, and because your symptoms are different from your usual acid reflux, further evaluation is needed to rule out a heart-related cause. You are being referred to the Emergency Department for additional testing and monitoring. Your daughter can drive you, if your symptoms worsen at any time, including increased chest pain, difficulty breathing, dizziness, or fainting, have her safely pull over and call 911

## 2023-09-06 NOTE — ED Provider Notes (Signed)
 [facility]    CSN: [id number] Arrival date & time: 09/06/23  1530      History   Chief Complaint Chief Complaint  Patient presents with   Shortness of Breath   Chest Pain    HPI Savannah Kramer is a 83 y.o. female.    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 She presents with four days of progressively worsening chest discomfort and shortness of breath, with worsening today. She describes the chest pain as heavy, located centrally, without radiation. The patient appears anxious about her symptoms and also reports dizziness, which she describes as a spinning sensation. She has had diarrhea predominantly in the evenings for the past three days, as well as nausea earlier today and intermittent headaches. She notes that although she has a history of chronic acid reflux, this episode feels different and more concerning. She denies palpitations, leg swelling, vomiting, blurred vision, numbness or tingling of the face or extremities. She reports a recent mild cough but denies fever, sore throat, runny nose, chills, or body aches. She also denies any urinary symptoms or hematuria.  The patient recently completed chemotherapy and her most recent CT scan on Aug 21, 2023, showed no evidence of recurrent or metastatic disease. She is not scheduled to follow up with oncology again until September. She is followed annually by Dr. Wilbert Hand in cardiology [facility]), with her last visit occurring in July 2024.  The following portions of the patient's history were reviewed and updated as appropriate: allergies, current medications, past family history, past medical history, past social history, past surgical history, and problem  list.       Past Medical History:  Diagnosis Date   A-fib (HCC)    Diabetes mellitus without complication (HCC)    High cholesterol    Hypertension     There are no active problems to display for this patient.   History reviewed. No pertinent surgical history.  OB History   No obstetric history on file.      Home Medications    Prior to Admission medications   Medication Sig Start Date End Date Taking? Authorizing Provider  amLODipine-atorvastatin (CADUET) 5-10 MG tablet Take 1 tablet by mouth daily.   Yes [provider]  apixaban (ELIQUIS) 5 MG TABS tablet Take 5 mg by mouth 2 (two) times daily.   Yes [provider]  calcium carbonate (OSCAL) 1500 (600 Ca) MG TABS tablet Take by mouth 2 (two) times daily with a meal.   Yes [provider]  cholecalciferol (VITAMIN D) 1000 units tablet Take 1,000 Units by mouth daily.   Yes [provider]  metFORMIN (GLUCOPHAGE) 500 MG tablet Take by mouth 2 (two) times daily with a meal.   Yes [provider]  omeprazole (PRILOSEC OTC) 20 MG tablet Take 20 mg by mouth daily.   Yes [provider]  pravastatin (PRAVACHOL) 80 MG tablet Take 80 mg by mouth daily.   Yes [provider]  sotalol (BETAPACE) 80 MG tablet Take 80 mg by mouth 2 (two) times daily.   Yes [provider]    Family History Family History  Problem Relation Age of Onset   Healthy Mother    Healthy Father     Social History Social History   Tobacco Use   Smoking  status: Never   Smokeless tobacco: Never  Substance Use Topics   Alcohol use: No   Drug use: No     Allergies   Aspirin, Lisinopril, and Prednisone   Review of Systems Review of Systems  Constitutional:  Negative for chills and fever.  HENT:         No recent URI or allergy symptoms   Eyes:  Negative for visual disturbance.  Respiratory:  Positive for cough (very mild) and shortness of breath (difficulty catching  her breath).   Cardiovascular:  Positive for chest pain (heavy; nonradiating to the middle of the chest). Negative for palpitations and leg swelling.  Gastrointestinal:  Positive for diarrhea (intermittent x 3 days, mainly in the evenings) and nausea (a little this morning). Negative for vomiting.       Has acid reflux. No burning sensation in epigastric or upper chest/throat as she usually does. Chest pain is different than typical GERD sx  Genitourinary:  Negative for dysuria and hematuria.       Completed chemo a couple of weeks ago for bladder cancer.   Musculoskeletal:  Negative for myalgias.  Neurological:  Positive for dizziness (sensation as if she is spinning) and headaches (a little). Negative for numbness.  Psychiatric/Behavioral:  The patient is nervous/anxious (feels anxious because of her chest pain).   All other systems reviewed and are negative.    Physical Exam Triage Vital Signs ED Triage Vitals [09/06/23 1539]  Encounter Vitals Group     BP (!) 154/61     Systolic BP Percentile      Diastolic BP Percentile      Pulse Rate 71     Resp 18     Temp 98.6 F (37 C)     Temp Source Oral     SpO2 97 %     Weight      Height      Head Circumference      Peak Flow      Pain Score      Pain Loc      Pain Education      Exclude from Growth Chart    No data found.  Updated Vital Signs BP (!) 154/61 (BP Location: Left Arm)   Pulse 71   Temp 98.6 F (37 C) (Oral)   Resp 18   SpO2 97%   Visual Acuity Right Eye Distance:   Left Eye Distance:   Bilateral Distance:    Right Eye Near:   Left Eye Near:    Bilateral Near:     Physical Exam Vitals reviewed.  Constitutional:      General: She is awake. She is not in acute distress.    Appearance: Normal appearance. She is well-developed. She is not ill-appearing or toxic-appearing.  HENT:     Head: Normocephalic.     Mouth/Throat:     Mouth: Mucous membranes are moist.  Eyes:     General: Vision grossly  intact.     Conjunctiva/sclera: Conjunctivae normal.  Cardiovascular:     Rate and Rhythm: Normal rate and regular rhythm.     Heart sounds: Normal heart sounds.  Pulmonary:     Effort: Pulmonary effort is normal.     Breath sounds: Normal breath sounds and air entry.  Abdominal:     Palpations: Abdomen is soft.  Musculoskeletal:        General: Normal range of motion.     Cervical back: Full passive range of motion without pain, normal  range of motion and neck supple.     Right lower leg: No edema.     Left lower leg: No edema.  Lymphadenopathy:     Cervical: No cervical adenopathy.  Skin:    General: Skin is warm and dry.  Neurological:     General: No focal deficit present.     Mental Status: She is alert and oriented to person, place, and time.     Sensory: Sensation is intact. No sensory deficit.     Motor: Motor function is intact.     Coordination: Coordination is intact.     Gait: Gait is intact.  Psychiatric:        Behavior: Behavior is cooperative.      UC Treatments / Results  Labs (all labs ordered are listed, but only abnormal results are displayed) Labs Reviewed - No data to display  EKG   Radiology No results found.  Procedures ED EKG  Date/Time: 09/06/2023 5:22 PM  Performed by: Maryruth Sol, FNP Authorized by: Maryruth Sol, FNP   Previous ECG:    Previous ECG:  Compared to current   Similarity:  No change   Comparison ECG info:  July 2024 Rate:    ECG rate:  73   ECG rate assessment: normal   Rhythm:    Rhythm: sinus rhythm   QRS:    QRS axis:  Normal   QRS conduction: RBBB   ST segments:    ST segments:  Depression   Depression:  V1, V2 and V3 Q waves:    Abnormal Q-waves: not present    (including critical care time)  Medications Ordered in UC Medications - No data to display  Initial Impression / Assessment and Plan / UC Course  I have reviewed the triage vital signs and the nursing notes.  Pertinent labs &  imaging results that were available during my care of the patient were reviewed by me and considered in my medical decision making (see chart for details).     83 year old female presents with a 4-day history of chest discomfort and dyspnea, with worsening symptoms today. She reports a heavy pressure sensation in the center of her chest accompanied by dizziness and anxiety, which she distinguishes from her usual acid reflux. Her medical history includes hypertension, atrial fibrillation on Eliquis, and recently completed chemotherapy for bladder cancer. EKG reveals right bundle branch block, consistent with prior tracings from July 2024. Cardiac and pulmonary exams are unremarkable. Given her cardiac risk factors and concerning symptoms, acute coronary syndrome must be ruled out. Patient is being transferred to the Emergency Department for further evaluation.  The patient presents with symptoms and exam findings that warrant a higher level of care and further evaluation. Given the clinical picture, emergency department (ED) assessment is recommended for advanced diagnostics and management. The patient is currently stable and appropriate for transport via private vehicle. A responsible adult will be driving. The patient is alert, oriented, demonstrates clear mental status, good insight into their illness, and sound judgment in making decisions regarding their care. The risks, rationale, and need for ED evaluation were discussed, and the patient is agreeable to the plan.  Documentation was completed with the aid of voice recognition software. Transcription may contain typographical errors. Final Clinical Impressions(s) / UC Diagnoses   Final diagnoses:  Chest pain, unspecified type  Atrial fibrillation, chronic (HCC)  Current use of anticoagulant therapy  Essential hypertension     Discharge Instructions      You were evaluated  today for chest discomfort and shortness of breath that has worsened  over the past few days. Given your history of atrial fibrillation, hypertension, and recent chemotherapy, and because your symptoms are different from your usual acid reflux, further evaluation is needed to rule out a heart-related cause. You are being referred to the Emergency Department for additional testing and monitoring. Your daughter can drive you, if your symptoms worsen at any time, including increased chest pain, difficulty breathing, dizziness, or fainting, have her safely pull over and call 911    ED Prescriptions   None    PDMP not reviewed this encounter.   Maryruth Sol, [place] 09/06/23 1725

## 2023-09-06 NOTE — ED Triage Notes (Signed)
 Pt arrives with c/o CP and SOB that started 4 days ago. Pt denies any other symptoms.

## 2023-09-06 NOTE — ED Notes (Signed)
 Patient transported to CT

## 2023-09-06 NOTE — ED Triage Notes (Signed)
 Patient presents to the office for chest discomfort and SOB x 4 days. Patient denies any other symptoms at this time.

## 2023-09-06 NOTE — ED Provider Notes (Signed)
 [facility] Provider Note   CSN: [id number] Arrival date & time: 09/06/23  1812     History {Add pertinent medical, surgical, social history, OB history to HPI:1} Chief Complaint  Patient presents with   Chest Pain    Savannah Kramer is a 83 y.o. female.  The history is provided by the patient and a relative. The history is limited by a language barrier. A language interpreter was used.  Chest Pain Pain location:  Substernal area Pain quality: not dull   Pain radiates to:  Does not radiate Pain severity:  Moderate Onset quality:  Gradual Duration:  4 days Timing:  Intermittent Progression:  Waxing and waning Chronicity:  New Context: not eating and not trauma   Relieved by:  Nothing Worsened by:  Nothing Ineffective treatments:  None tried Associated symptoms: shortness of breath   Risk factors: not female   Patient with HTN with 4 days of intermittent SSCP.      Past Medical History:  Diagnosis Date   A-fib (HCC)    Diabetes mellitus without complication (HCC)    High cholesterol    Hypertension     Home Medications Prior to Admission medications   Medication Sig Start Date End Date Taking? Authorizing Provider  amLODipine-atorvastatin (CADUET) 5-10 MG tablet Take 1 tablet by mouth daily.    [provider]  apixaban (ELIQUIS) 5 MG TABS tablet Take 5 mg by mouth 2 (two) times daily.    [provider]  calcium carbonate (OSCAL) 1500 (600 Ca) MG TABS tablet Take by mouth 2 (two) times daily with a meal.    [provider]  cholecalciferol (VITAMIN D) 1000 units tablet Take 1,000 Units by mouth daily.    [provider]  metFORMIN (GLUCOPHAGE) 500 MG tablet Take by mouth 2 (two) times daily with a meal.    [provider]  omeprazole (PRILOSEC OTC) 20 MG tablet Take 20 mg by mouth daily.    [provider]  pravastatin (PRAVACHOL) 80 MG tablet Take 80 mg by mouth daily.     [provider]  sotalol (BETAPACE) 80 MG tablet Take 80 mg by mouth 2 (two) times daily.    [provider]      Allergies    Aspirin, Lisinopril, and Prednisone    Review of Systems   Review of Systems  Respiratory:  Positive for shortness of breath.   Cardiovascular:  Positive for chest pain.    Physical Exam Updated Vital Signs BP (!) 146/65 (BP Location: Left Arm)   Pulse 71   Temp 98.5 F (36.9 C) (Oral)   Resp 19   Ht 5' (1.524 m)   Wt 61.2 kg   SpO2 95%   BMI 26.37 kg/m  Physical Exam  ED Results / Procedures / Treatments   Labs (all labs ordered are listed, but only abnormal results are displayed) Labs Reviewed  BASIC METABOLIC PANEL WITH GFR - Abnormal; Notable for the following components:      Result Value   Sodium 133 (*)    Chloride 97 (*)    CO2 19 (*)    Glucose, Bld 162 (*)    Anion gap 17 (*)    All other components within normal limits  CBC - Abnormal; Notable for the following components:   Hemoglobin 11.3 (*)    HCT 33.2 (*)    All other components within normal limits  RESP PANEL BY RT-PCR (RSV, FLU A&B,  COVID)  RVPGX2  PRO BRAIN NATRIURETIC PEPTIDE  TROPONIN T, HIGH SENSITIVITY  TROPONIN T, HIGH SENSITIVITY    EKG EKG Interpretation Date/Time:  Thursday Sep 06 2023 18:30:24 EDT Ventricular Rate:  75 PR Interval:  204 QRS Duration:  128 QT Interval:  442 QTC Calculation: 494 R Axis:   35  Text Interpretation: Sinus rhythm Right bundle branch block Confirmed by Maralee Senate, [place]st ([id number]) on 09/06/2023 11:00:11 PM  Radiology CT Angio Chest PE W and/or Wo Contrast Result Date: 09/06/2023 CLINICAL DATA:  Syncope/presyncope, cerebrovascular cause suspected. Chest pain and shortness of breath. EXAM: CT ANGIOGRAPHY CHEST WITH CONTRAST TECHNIQUE: Multidetector CT imaging of the chest was performed using the standard protocol during bolus administration of intravenous contrast. Multiplanar CT image reconstructions and MIPs  were obtained to evaluate the vascular anatomy. RADIATION DOSE REDUCTION: This exam was performed according to the departmental dose-optimization program which includes automated exposure control, adjustment of the mA and/or kV according to patient size and/or use of iterative reconstruction technique. CONTRAST:  75mL OMNIPAQUE IOHEXOL 350 MG/ML SOLN COMPARISON:  Same day chest radiograph and CTA chest 12/06/2007 FINDINGS: Cardiovascular: Negative for acute pulmonary embolism. No pericardial effusion. Coronary artery and aortic atherosclerotic calcification. Normal caliber thoracic aorta. No dissection. Mediastinum/Nodes: Esophagus is unremarkable. Patent trachea. Normal thyroid. No thoracic adenopathy. Lungs/Pleura: Centrilobular micro nodules in the right upper lobe compatible with small airway infection/inflammation. 10 x 8 mm cavitary nodule in the right apex (see series 13/image 15). The high bilateral apices are not included in the exam. No pleural effusion or pneumothorax. Upper Abdomen: Cholelithiasis. No evidence of acute cholecystitis. No acute abnormality. Musculoskeletal: No acute fracture. Review of the MIP images confirms the above findings. IMPRESSION: 1. Negative for acute pulmonary embolism. 2. 10 x 8 mm cavitary nodule in the right apex. This may be infectious/inflammatory however malignancy is not excluded. Consider one of the following in 3 months for both low-risk and high-risk individuals: (a) repeat chest CT, (b) follow-up PET-CT, or (c) tissue sampling. This recommendation follows the consensus statement: Guidelines for Management of Incidental Pulmonary Nodules Detected on CT Images: From the Fleischner Society 2017; Radiology 2017; 284:228-243. 3. Centrilobular micro nodules in the right upper lobe compatible with small airway infection/inflammation. 4. Aortic Atherosclerosis (ICD10-I70.0). Electronically Signed   By: Rozell Cornet M.D.   On: 09/06/2023 23:55   DG Chest 2 View Result  Date: 09/06/2023 CLINICAL DATA:  Chest pain, shortness of breath, mild cough EXAM: CHEST - 2 VIEW COMPARISON:  08/09/2022 FINDINGS: Right chest wall Port-A-Cath with tip at the superior cavoatrial junction. Stable cardiomediastinal silhouette. Aortic atherosclerotic calcification. No focal consolidation, pleural effusion, or pneumothorax. No displaced rib fractures. IMPRESSION: No active cardiopulmonary disease. Electronically Signed   By: Rozell Cornet M.D.   On: 09/06/2023 20:18    Procedures Procedures  {Document cardiac monitor, telemetry assessment procedure when appropriate:1}  Medications Ordered in ED Medications  iohexol (OMNIPAQUE) 350 MG/ML injection 75 mL (75 mLs Intravenous Contrast Given 09/06/23 2338)    ED Course/ Medical Decision Making/ A&P   {   Click here for ABCD2, HEART and other calculatorsREFRESH Note before signing :1}                              Medical Decision Making Amount and/or Complexity of Data Reviewed Labs: ordered. Radiology: ordered.  Risk Prescription drug management. Decision regarding hospitalization.   ***  {Document critical care time when appropriate:1} {Document review of  labs and clinical decision tools ie heart score, Chads2Vasc2 etc:1}  {Document your independent review of radiology images, and any outside records:1} {Document your discussion with family members, caretakers, and with consultants:1} {Document social determinants of health affecting pt's care:1} {Document your decision making why or why not admission, treatments were needed:1} Final Clinical Impression(s) / ED Diagnoses Final diagnoses:  AV block, Mobitz 2  Chest pain, unspecified type    Rx / DC Orders ED Discharge Orders     None

## 2023-09-07 ENCOUNTER — Observation Stay (HOSPITAL_COMMUNITY)

## 2023-09-07 ENCOUNTER — Telehealth: Payer: Self-pay | Admitting: Pulmonary Disease

## 2023-09-07 ENCOUNTER — Other Ambulatory Visit: Payer: Self-pay

## 2023-09-07 DIAGNOSIS — R079 Chest pain, unspecified: Secondary | ICD-10-CM

## 2023-09-07 DIAGNOSIS — R911 Solitary pulmonary nodule: Secondary | ICD-10-CM | POA: Diagnosis present

## 2023-09-07 DIAGNOSIS — R918 Other nonspecific abnormal finding of lung field: Secondary | ICD-10-CM

## 2023-09-07 DIAGNOSIS — Z743 Need for continuous supervision: Secondary | ICD-10-CM | POA: Diagnosis not present

## 2023-09-07 DIAGNOSIS — I499 Cardiac arrhythmia, unspecified: Secondary | ICD-10-CM | POA: Diagnosis present

## 2023-09-07 DIAGNOSIS — R0600 Dyspnea, unspecified: Secondary | ICD-10-CM

## 2023-09-07 DIAGNOSIS — I959 Hypotension, unspecified: Secondary | ICD-10-CM | POA: Diagnosis not present

## 2023-09-07 DIAGNOSIS — I441 Atrioventricular block, second degree: Secondary | ICD-10-CM | POA: Diagnosis present

## 2023-09-07 DIAGNOSIS — Z8551 Personal history of malignant neoplasm of bladder: Secondary | ICD-10-CM

## 2023-09-07 DIAGNOSIS — R0602 Shortness of breath: Secondary | ICD-10-CM | POA: Diagnosis not present

## 2023-09-07 DIAGNOSIS — R001 Bradycardia, unspecified: Secondary | ICD-10-CM | POA: Diagnosis not present

## 2023-09-07 LAB — ECHOCARDIOGRAM COMPLETE
Area-P 1/2: 4.83 cm2
Height: 60 in
P 1/2 time: 708 ms
S' Lateral: 2.2 cm
Weight: 2160.01 [oz_av]

## 2023-09-07 MED ORDER — VITAMIN D 25 MCG (1000 UNIT) PO TABS
1000.0000 [IU] | ORAL_TABLET | Freq: Every day | ORAL | Status: DC
  Administered 2023-09-07: 1000 [IU] via ORAL
  Filled 2023-09-07: qty 1

## 2023-09-07 MED ORDER — APIXABAN 5 MG PO TABS
5.0000 mg | ORAL_TABLET | Freq: Two times a day (BID) | ORAL | Status: DC
  Administered 2023-09-07: 5 mg via ORAL
  Filled 2023-09-07: qty 2

## 2023-09-07 MED ORDER — CALCIUM CARBONATE 1250 (500 CA) MG PO TABS
1500.0000 mg | ORAL_TABLET | Freq: Every day | ORAL | Status: DC
  Administered 2023-09-07: 1250 mg via ORAL
  Filled 2023-09-07: qty 1

## 2023-09-07 MED ORDER — PRAVASTATIN SODIUM 40 MG PO TABS
80.0000 mg | ORAL_TABLET | Freq: Every day | ORAL | Status: DC
  Administered 2023-09-07: 80 mg via ORAL
  Filled 2023-09-07: qty 2

## 2023-09-07 MED ORDER — ACETAMINOPHEN 325 MG PO TABS: 650.0000 mg | ORAL_TABLET | ORAL | 0 refills | Status: AC | PRN

## 2023-09-07 MED ORDER — ONDANSETRON HCL 4 MG/2ML IJ SOLN: 4.0000 mg | Freq: Four times a day (QID) | INTRAMUSCULAR | Status: DC | PRN

## 2023-09-07 MED ORDER — PANTOPRAZOLE SODIUM 40 MG PO TBEC
40.0000 mg | DELAYED_RELEASE_TABLET | Freq: Every day | ORAL | Status: DC
  Administered 2023-09-07: 40 mg via ORAL
  Filled 2023-09-07: qty 1

## 2023-09-07 MED ORDER — ACETAMINOPHEN 325 MG PO TABS: 650.0000 mg | ORAL_TABLET | ORAL | Status: DC | PRN

## 2023-09-07 MED ORDER — AMLODIPINE BESYLATE 5 MG PO TABS
5.0000 mg | ORAL_TABLET | Freq: Every day | ORAL | Status: DC
  Administered 2023-09-07: 5 mg via ORAL
  Filled 2023-09-07: qty 1

## 2023-09-07 NOTE — Care Management Obs Status (Signed)
 MEDICARE OBSERVATION STATUS NOTIFICATION   Patient Details  Name: Savannah Kramer MRN: [id number] Date of Birth: [birth year]   Medicare Observation Status Notification Given:       Janith Melnick 09/07/2023, 1:18 PM

## 2023-09-07 NOTE — Progress Notes (Signed)
 Physical Therapy Quick Note  PT has completed initial evaluation.    Overall, patient at modified independent assistance level.   PT Follow up recommended: No follow up PT Equipment recommended:  None recommended Complete evaluation note to follow.     Lonell Rives, [place] [facility] [phone]

## 2023-09-07 NOTE — ED Notes (Signed)
 Called [facility]  Waiting on patient discharges before bed ready

## 2023-09-07 NOTE — Telephone Encounter (Signed)
 Please provide new consult appt with any provider @ [address]  for cavitary nodule RUL (hospitalist request)

## 2023-09-07 NOTE — H&P (Signed)
 SAME DAY HISTORY AND PHYSICAL/DISCHARGE SUMMARY   Patient: Savannah Kramer ZOX:[id number] DOB: 1941/04/02 DOA: 09/06/2023 DOS: the patient was seen and examined on 09/07/2023 PCP: Lieutenant Reese., MD  Patient coming from: Home  Chief Complaint:  Chief Complaint  Patient presents with   Chest Pain   HPI: Savannah Kramer is a 83 y.o. 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  Patient noted that she started having progressive worsening chest discomfort and shortness of breath about 4 days ago.  She states that the chest pain felt like a pressure and a heaviness and was located centrally without radiation.  Given her chest discomfort it caused the patient to have significant and is likely symptoms including and reporting dizziness.  Prior to this she had diarrhea probably in the evenings for the last 3 days and some nausea but this is resolved.  She has a history of acid reflux but she felt that these episodes were different so she went to go and see her PCP who recommended her come to the ED for further evaluation.  She admitted having a slight cough but no fevers, sore throat, chills or bodyaches or any urinary or GU symptoms.  Given her symptoms of intermittent chest discomfort and shortness of breath she presented to the ED and underwent workup.  Cardiology was consulted for further evaluation recommendations and of note she was noted to have an intermittent transient type II block.  By the time I evaluated the patient her symptoms are completely resolved and she felt closer to her baseline.  Review of Systems: As  mentioned in the history of present illness. All other systems reviewed and are negative. Past Medical History:  Diagnosis Date   A-fib (HCC)    Diabetes mellitus without complication (HCC)    High cholesterol    Hypertension    History reviewed. No pertinent surgical history. Social History:  reports that she has never smoked. She has never used smokeless tobacco. She reports that she does not drink alcohol and does not use drugs.  Allergies  Allergen Reactions   Amoxicillin-Pot Clavulanate Nausea Only and Other (See Comments)    unknown   Levofloxacin Other (See Comments)    Flushing (ALLERGY/intolerance); diarrhea   Prednisone Other (See Comments)    Has been told, due to her heart condition, not to take unless absolutely necessary-pt denies 04/23/2023, does not remember ever being told this   Aspirin Nausea Only   Lisinopril Cough   Family History  Problem Relation Age of Onset   Healthy Mother    Healthy Father    Prior to Admission medications   Medication Sig Start Date End Date Taking? Authorizing Provider  amLODipine (NORVASC) 5 MG tablet Take 5 mg by mouth daily. 07/14/23  Yes [provider]  apixaban (ELIQUIS) 5 MG TABS tablet Take 5 mg by mouth 2 (two) times daily.   Yes [provider]  calcium carbonate (OSCAL) 1500 (600 Ca) MG TABS tablet Take 1,500 mg by mouth daily in the afternoon.   Yes [provider]  cholecalciferol (VITAMIN D) 1000 units tablet Take 1,000 Units by mouth daily in the afternoon.   Yes [provider]  esomeprazole (NEXIUM) 40 MG capsule Take 40 mg by mouth 2 (two) times daily. 07/14/23  Yes [provider]  losartan (COZAAR) 100 MG tablet Take 100 mg by mouth daily. 08/10/23  Yes [provider]  metFORMIN (GLUCOPHAGE-XR) 500 MG 24 hr tablet Take 500 mg by mouth every morning. 07/09/23  Yes [provider]  pravastatin (PRAVACHOL) 80 MG tablet Take 80 mg by mouth daily.   Yes [provider]  sotalol (BETAPACE) 80 MG tablet Take 40 mg by mouth 2 (two) times daily.   Yes [provider]   Physical Exam: Vitals:   09/07/23 0900 09/07/23 1000 09/07/23 1115 09/07/23 1200  BP: (!) 150/54 (!) 130/54 (!) 111/50   Pulse: 70 (!) 55 73   Resp: (!) 23 18 17    Temp:    98.1 F (36.7 C)  TempSrc:    Oral  SpO2: 98% 97% 100%   Weight:      Height:       Examination: Physical Exam:  Constitutional: WN/WD overweight elderly Hispanic female in no acute distress appears calm Respiratory: Mildly diminished to auscultation bilaterally with some coarse breath sounds, no wheezing, rales, rhonchi or crackles. Normal respiratory effort and patient is not tachypenic. No accessory muscle use.  Unlabored breathing Cardiovascular: RRR, no murmurs / rubs / gallops. S1 and S2 auscultated. No extremity edema.   Abdomen: Soft, non-tender, slightly distended secondary to body habitus. Bowel sounds positive.  GU: Deferred. Musculoskeletal: No clubbing / cyanosis of digits/nails. No joint deformity upper and lower extremities. Skin: No rashes, lesions, ulcers on limited skin evaluation. No induration; Warm and dry.  Neurologic: CN 2-12 grossly intact with no focal deficits. Romberg sign and cerebellar reflexes not assessed.  Psychiatric: Normal judgment and insight. Alert and oriented x 3. Normal mood and appropriate affect.   Data Reviewed:  Recent Results (from the past 2160 hours)  Basic metabolic panel     Status: Abnormal   Collection Time: 09/06/23  6:37 PM  Result Value Ref Range   Sodium 133 (L) 135 - 145 mmol/L   Potassium 4.4 3.5 - 5.1 mmol/L   Chloride 97 (L) 98 - 111 mmol/L   CO2 19 (L) 22 - 32 mmol/L   Glucose, Bld 162 (H) 70 - 99 mg/dL    Comment: Glucose reference range applies only to samples taken after fasting for at least 8 hours.   BUN 15 8 - 23 mg/dL   Creatinine, Ser 4.09 0.44 - 1.00 mg/dL   Calcium 9.2 8.9 - 81.1 mg/dL   GFR, Estimated >91 >47  mL/min    Comment: (NOTE) Calculated using the CKD-EPI Creatinine Equation (2021)    Anion gap 17 (H) 5 - 15    Comment: Performed at [facility], [address] [place], [place] [address]  CBC     Status: Abnormal   Collection Time: 09/06/23  6:37 PM  Result Value Ref Range   WBC 7.5 4.0 - 10.5 K/uL   RBC 4.09 3.87 - 5.11 MIL/uL   Hemoglobin 11.3 (L) 12.0 - 15.0 g/dL   HCT 21.3 (L) 08.6 - 57.8 %   MCV 81.2 80.0 - 100.0 fL   MCH 27.6 26.0 - 34.0 pg   MCHC 34.0 30.0 - 36.0 g/dL   RDW 46.9 62.9 - 52.8 %   Platelets 363 150 - 400 K/uL   nRBC 0.0 0.0 - 0.2 %    Comment: Performed at [facility], [address] [place], [place] [address]  Troponin T, High Sensitivity     Status: None   Collection Time: 09/06/23  6:37 PM  Result Value Ref Range   Troponin T High Sensitivity <15 <19 ng/L    Comment: (NOTE) Biotin concentrations > 1000 ng/mL falsely decrease TnT results.  Serial cardiac troponin measurements are suggested.  Refer to the Links section for chest pain algorithms and additional  guidance. Performed at [facility], [address] [place], [place] [address]   Pro Brain natriuretic peptide     Status: None   Collection Time: 09/06/23  6:37 PM  Result Value Ref Range   Pro Brain Natriuretic Peptide 84.3 <300.0 pg/mL    Comment: (NOTE) Age Group        Cut-Points    Interpretation  < 50 years     450 pg/mL       NT-proBNP > 450 pg/mL indicates                                ADHF is likely              50 to 75 years  900 pg/mL      NT-proBNP > 900 pg/mL indicates          ADHF is likely  > 75 years      1800 pg/mL     NT-proBNP > 1800 pg/mL indicates          ADHF is likely                           All ages    Results between       Indeterminate. Further clinical             300 and the cut-   information is needed to determine            point for age group   if ADHF is present.                                                              Elecsys proBNP II/ Elecsys proBNP II STAT           Cut-Point                       Interpretation  300 pg/mL                    NT-proBNP <300pg/mL indicates                             ADHF is not likely  Performed at [facility], [address] [place], [place] [address]   Resp panel by RT-PCR (RSV, Flu A&B, Covid) Anterior Nasal Swab     Status: None   Collection Time: 09/06/23  9:31 PM   Specimen: Anterior Nasal Swab  Result Value Ref Range   SARS Coronavirus 2 by RT PCR NEGATIVE NEGATIVE    Comment: (NOTE) SARS-CoV-2 target nucleic acids are NOT DETECTED.  The SARS-CoV-2 RNA is generally detectable in upper respiratory  specimens during the acute phase of infection. The lowest concentration of SARS-CoV-2 viral copies this assay can detect is 138 copies/mL. A negative result does not preclude SARS-Cov-2 infection and should not be used as the sole basis for treatment or other patient management decisions. A negative result may occur with  improper specimen collection/handling, submission of specimen other than nasopharyngeal swab, presence of viral mutation(s) within the areas targeted by this assay, and inadequate number of viral copies(<138 copies/mL). A negative result must be combined with clinical observations, patient history, and epidemiological information. The expected result is Negative.  Fact Sheet for Patients:  BloggerCourse.com  Fact Sheet for Healthcare Providers:  SeriousBroker.it  This test is no t yet approved or cleared by the [place]  FDA and  has been authorized for detection and/or diagnosis of SARS-CoV-2 by FDA under an Emergency Use Authorization (EUA). This EUA will remain  in effect (meaning this test can be used) for the duration of the COVID-19 declaration under Section 564(b)(1) of the Act, 21 U.S.C.section 360bbb-3(b)(1), unless the authorization is  terminated  or revoked sooner.       Influenza A by PCR NEGATIVE NEGATIVE   Influenza B by PCR NEGATIVE NEGATIVE    Comment: (NOTE) The Xpert Xpress SARS-CoV-2/FLU/RSV plus assay is intended as an aid in the diagnosis of influenza from Nasopharyngeal swab specimens and should not be used as a sole basis for treatment. Nasal washings and aspirates are unacceptable for Xpert Xpress SARS-CoV-2/FLU/RSV testing.  Fact Sheet for Patients: BloggerCourse.com  Fact Sheet for Healthcare Providers: SeriousBroker.it  This test is not yet approved or cleared by the [place]  FDA and has been authorized for detection and/or diagnosis of SARS-CoV-2 by FDA under an Emergency Use Authorization (EUA). This EUA will remain in effect (meaning this test can be used) for the duration of the COVID-19 declaration under Section 564(b)(1) of the Act, 21 U.S.C. section 360bbb-3(b)(1), unless the authorization is terminated or revoked.     Resp Syncytial Virus by PCR NEGATIVE NEGATIVE    Comment: (NOTE) Fact Sheet for Patients: BloggerCourse.com  Fact Sheet for Healthcare Providers: SeriousBroker.it  This test is not yet approved or cleared by the [place]  FDA and has been authorized for detection and/or diagnosis of SARS-CoV-2 by FDA under an Emergency Use Authorization (EUA). This EUA will remain in effect (meaning this test can be used) for the duration of the COVID-19 declaration under Section 564(b)(1) of the Act, 21 U.S.C. section 360bbb-3(b)(1), unless the authorization is terminated or revoked.  Performed at [facility], [address] [place], [place] [address]   Troponin T, High Sensitivity     Status: None   Collection Time: 09/06/23  9:31 PM  Result Value Ref Range   Troponin T High Sensitivity <15 <19 ng/L    Comment: (NOTE) Biotin concentrations > 1000 ng/mL  falsely decrease TnT results.  Serial cardiac troponin measurements are suggested.  Refer to the Links section for chest pain algorithms and additional  guidance. Performed at [facility], [address] [place], [place] [address]   ECHOCARDIOGRAM COMPLETE     Status: None (In process)   Collection Time: 09/07/23  1:53 PM  Result Value Ref Range   Weight 2,160.01 oz   Height 60 in   BP 111/50 mmHg    EKG: Normal sinus rhythm with a rate of 73 and a right bundle-branch block with some low voltage and a QTc of 504 ms  Assessment and  Plan: No notes have been filed under this hospital service. Service: Hospitalist  Chest Pain and Dyspnea Rule out ACS: Troponins negative at less than 15 x 2.  Symptoms have resolved completely.  Cardiology was consulted and they do not believe that this is ACS based on her labs and for chest pain.  There is low concern for coronary vasospasm and recommended getting an echocardiogram.  Echocardiogram was reviewed by Dr. Abel Hoe who saw the had normal LV function with no pericardial effusion or regional wall motion abnormalities.  Patient did have mild to moderate MR and mild aortic insufficiency.  EKG showed no ischemic findings.  Cardiology has signed off the case and recommending follow-up in the outpatient setting with [facility] cardiology where she goes and recommending outpatient CTA or outpatient stress testing.  Patient ambulated without issues and did not desaturate.  She will need to follow-up with PCP, cardiology within 1 week as she is medically stable and cleared by Cardiology   Transient second-degree AV block type II: She denies any dizziness now and telemetry was reviewed by cardiology and recommended no further workup at this time.  Current recommendation is to still continue beta-blocker at this time and so she was resumed on her sotalol at discharge  Atrial Fibrillation: Rate controlled.  Resume home sotalol continue apixaban  anticoagulation  Cavitary Lung Nodule/Pulmonary Nodules: Obtain Sputum AFB but not really expectorating Sputum. CTA PE Protocol noted a 10 x 8 mm cavitary nodule in the right apex which was considered infectious or inflammatory however membranes is not excluded.  Current recommendations for her to follow-up in 3 months for repeat chest CT or a follow-up PET CT scan or tissue sampling.  Discussed with pulmonary and they will follow-up in outpatient setting and arranging follow-up in the clinic.  Notably there is also centrilobular micronodules in the right upper lobe compatible with small airway infection or inflammation.  Currently she is asymptomatic and not dyspneic, febrile or having a leukocytosis.  Will need close follow-up; of note is the CT scan done at [facility] shows that she had some improvement in the lung nodule that she had previously..  Essential HTN: C/w Amlodipine 5 mg po Daily; Held Losartan while hospitalized but can resume @ D/C  HLD: C/w Pravastatin 80 mg po Daily  Hyponatremia: Mild. Na+ is now 133. CTM and Trend in the outpatient setting w/in 1 week  Elevated AG Metabolic Acidosis: Mild.  The O2 was 97, anion gap was 17, chloride low at 97.  Continue monitor trend and repeat CMP within 1 week  Diabetes Mellitus Type 2 w/o Complication: c/w Home Metformin 500 mg po BID @ D/C. Blood Sugar elevated at 162 on BMP.  Further care per PCP in the outpt setting  Normocytic Anemia: Hgb/Hct is now 11.3/33.2.  Check anemia panel in outpatient setting.  Continue to monitor for signs and symptoms bleeding; no overt bleeding noted.  Repeat CBC within 1 week  Hx of Urotheilial Bladder Carcinoma w/ Muscle Invasion: She is status post transurethral resection of bladder tumor followed by chemotherapy and radiation.  Will need to follow-up with medical oncology in outpatient setting and further workup for her pulmonary nodules as above  GERD/GI Prophylaxis: C/w PPI w/ Pantoprazole 40 mg po Daily  hospitalized and then resume home Esomeprazole 40 mg p.o. daily  Overweight: Complicates overall prognosis and care. Estimated body mass index is 26.37 kg/m as calculated from the following:   Height as of this encounter: 5' (1.524 m).  Weight as of this encounter: 61.2 kg. Weight Loss and Dietary Counseling given  Advance Care Planning:   Code Status: Full Code   Consults: Cardiology  Family Communication: D/w Daughter @ Bedside  Severity of Illness: The appropriate patient status for this patient is OBSERVATION. Observation status is judged to be reasonable and necessary in order to provide the required intensity of service to ensure the patient's safety. The patient's presenting symptoms, physical exam findings, and initial radiographic and laboratory data in the context of their medical condition is felt to place them at decreased risk for further clinical deterioration. Furthermore, it is anticipated that the patient will be medically stable for discharge from the hospital within 2 midnights of admission.   Author: Aura Leeds, DO [facility] 09/07/2023 2:45 PM  For on call review [email or web address].

## 2023-09-07 NOTE — ED Notes (Signed)
 [facility] called for ED to ED with Nora Beal accepting

## 2023-09-07 NOTE — Evaluation (Signed)
**Note Savannah-Identified via Obfuscation**  Physical Therapy Evaluation Patient Details Name: Savannah Kramer MRN: [id number] DOB: Dec 22, 1940 Today's Date: 09/07/2023  History of Present Illness  Pt is an 83 y.o. female presenting to [facility] ED with chest pain and SOB on 09/06/23. 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 PMH is significant for A-fib, HTN, and DM.  Clinical Impression  Prior to admittance pt was mobilizing independently without any AD. Pt presents to evaluation with limitations in mobility, strength, and balance, all affecting patient's ability to mobilize near baseline. Pt was able to ambulate with supervision and no AD. Pt and pt's daughter feel confident in how pt is currently mobilizing and do not feel they need follow-up therapies at this time. No follow-up therapies recommended.       If plan is discharge home, recommend the following: A little help with walking and/or transfers;A little help with bathing/dressing/bathroom;Assist for transportation;Help with stairs or ramp for entrance   Can travel by private vehicle        Equipment Recommendations None recommended by PT  Recommendations for Other Services       Functional Status Assessment Patient has had a recent decline in their functional status and demonstrates the ability to make significant improvements in function in a reasonable and predictable amount of time.     Precautions / Restrictions Precautions Precautions: Fall Recall of Precautions/Restrictions: Intact Restrictions Weight Bearing Restrictions Per Provider Order: No      Mobility  Bed Mobility Overal bed mobility: Needs Assistance Bed Mobility: Supine to Sit     Supine to sit: HOB elevated, Used rails, Contact guard     General bed mobility comments: Pt used therapist's arm to pull herself forward to edge of bed; no physical assistance given. Increased time to complete.    Transfers Overall transfer level: Needs assistance Equipment  used: None Transfers: Sit to/from Stand Sit to Stand: Supervision                Ambulation/Gait Ambulation/Gait assistance: Supervision Gait Distance (Feet): 250 Feet Assistive device: None Gait Pattern/deviations: Step-through pattern, Decreased stride length, Trunk flexed Gait velocity: reduced Gait velocity interpretation: <1.8 ft/sec, indicate of risk for recurrent falls   General Gait Details: Pt utilizes reciprocal gait pattern, and demonstrates decreased cadence, and decreased step length.  Stairs            Wheelchair Mobility     Tilt Bed    Modified Rankin (Stroke Patients Only)       Balance Overall balance assessment: Needs assistance Sitting-balance support: No upper extremity supported, Feet supported Sitting balance-Leahy Scale: Good Sitting balance - Comments: Pt sat EOB without UE support for several minutes.   Standing balance support: No upper extremity supported, During functional activity Standing balance-Leahy Scale: Good                               Pertinent Vitals/Pain Pain Assessment Pain Assessment: No/denies pain    Home Living Family/patient expects to be discharged to:: Private residence Living Arrangements: Spouse/significant other Available Help at Discharge: Family;Available 24 hours/day (daughter lives closeby and is able to help) Type of Home: House Home Access: Stairs to enter Entrance Stairs-Rails: Right Entrance Stairs-Number of Steps: 4   Home Layout: One level Home Equipment: Agricultural consultant (2 wheels);Cane - single point;Shower seat;Wheelchair - manual      Prior Function Prior Level of Function : Independent/Modified Independent;History of Falls (  last six months)             Mobility Comments: Pt is mod I with all mobility not using any AD. ADLs Comments: Pt reports independence with all ADLs     Extremity/Trunk Assessment   Upper Extremity Assessment Upper Extremity Assessment:  Overall WFL for tasks assessed    Lower Extremity Assessment Lower Extremity Assessment: Generalized weakness    Cervical / Trunk Assessment Cervical / Trunk Assessment: Kyphotic  Communication   Communication Communication: No apparent difficulties    Cognition Arousal: Alert Behavior During Therapy: WFL for tasks assessed/performed   PT - Cognitive impairments: No apparent impairments                         Following commands: Intact       Cueing Cueing Techniques: Verbal cues, Tactile cues     General Comments General comments (skin integrity, edema, etc.): vitals remained stable throughout session, with oxygen in high 90s    Exercises     Assessment/Plan    PT Assessment All further PT needs can be met in the next venue of care  PT Problem List Decreased strength;Decreased balance;Decreased mobility;Decreased activity tolerance       PT Treatment Interventions      PT Goals (Current goals can be found in the Care Plan section)  Acute Rehab PT Goals Patient Stated Goal: to be able to go home and garden PT Goal Formulation: With patient/family Time For Goal Achievement: 09/21/23 Potential to Achieve Goals: Good    Frequency       Co-evaluation               AM-PAC PT "6 Clicks" Mobility  Outcome Measure Help needed turning from your back to your side while in a flat bed without using bedrails?: A Little Help needed moving from lying on your back to sitting on the side of a flat bed without using bedrails?: A Little Help needed moving to and from a bed to a chair (including a wheelchair)?: A Little Help needed standing up from a chair using your arms (e.g., wheelchair or bedside chair)?: A Little Help needed to walk in hospital room?: A Little Help needed climbing 3-5 steps with a railing? : A Little 6 Click Score: 18    End of Session Equipment Utilized During Treatment: Gait belt Activity Tolerance: Patient tolerated treatment  well Patient left: in chair;with call bell/phone within reach;with family/visitor present Nurse Communication: Mobility status;Other (comment) (no supplemental oxygen needed) PT Visit Diagnosis: Muscle weakness (generalized) (M62.81);History of falling (Z91.81)    Time: 1610-9604 PT Time Calculation (min) (ACUTE ONLY): 22 min   Charges:   PT Evaluation $PT Eval Low Complexity: 1 Low   PT General Charges $$ ACUTE PT VISIT: 1 Visit         Lonell Rives, SPT Acute Rehab [phone]   Lonell Rives 09/07/2023, 4:27 PM

## 2023-09-07 NOTE — Progress Notes (Signed)
 PHARMACY ROUNDING NOTE  Savannah Kramer is a 83 y.o. female awaiting admission. A chart review was completed to evaluate prior to admission medications, antibiotic therapy and labs/vitals. The following interventions were made:  Apixaban  restarted  This was discussed with the ED or admitting provider and/or nurse/paramedic.   Jerri Morale, PharmD, BCPS, BCEMP Clinical Pharmacist Please see AMION for all pharmacy numbers 09/07/2023 8:18 AM

## 2023-09-07 NOTE — Progress Notes (Signed)
 SATURATION QUALIFICATIONS: (This note is used to comply with regulatory documentation for home oxygen)  Patient Saturations on Room Air at Rest = 98%  Patient Saturations on Room Air while Ambulating = 98%  Please briefly explain why patient needs home oxygen: Supplemental oxygen not needed; pt is asymptomatic .      Lonell Rives, [place] [facility] [phone]

## 2023-09-07 NOTE — ED Provider Notes (Signed)
 83 year old female seen here by the overnight team for chest pain.  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  The case was discussed with cardiology fellow and patient was admitted to medicine service however there are no beds immediately available.  She has remained hemodynamically stable but is in and out of Mobitz 2 this morning.  In order to expedite her transfer to [facility] we have made arrangements to transfer ED to ED.  Dr. Nora Beal (ED attending [facility]) is accepting physician.  She will be transported via [facility] and will make the hospitalist team aware of plan for transfer to ED   Sallyanne Creamer, DO 09/07/23 [id number]

## 2023-09-07 NOTE — Consult Note (Addendum)
 Cardiology Consultation   Patient ID: Savannah Kramer MRN: [id number]; DOB: Jun 19, 1940  Admit date: 09/06/2023 Date of Consult: 09/07/2023  PCP:  Savannah Kramer., MD   [facility] Providers Cardiologist:  None    Click here to update MD or APP on Care Team, Refresh:1}     Patient Profile: Savannah Kramer is a 83 y.o. female with a hx of Hypertension,HLD,DM,bladder Cancer and Afib on home Eliquis   who is being seen 09/07/2023 for the evaluation of shortness of breath and intermittent chest pain  at the request of EDP.  History of Present Illness: Savannah Kramer is a 83 year old spanish speaking pleasant woman with the history noted above who presents for an intermittent pressure-like feeling on her chest with an associated SOB for 1 day duration. She reports she was sitting down at home when she suddenly felt like something was sitting on her chest which made her very anxious and she started gasping for her. Her SOB has somewhat gotten better but her chest discomforts comes and go. She hasn't had this experience before and she endorses great compliance with all her home medications for her other health conditions. She points mid sternally when asked to show where her chest pressure is located. She denies any radiation of the pressure. She denies any use of tobacco, alcohol and or illicit drugs. She hasn't been through any stressful circumstance lately and describes her daily activity as routine for  many years. She is unsure what brings on the chest pressure and nothing that she knows makes it worse or better.    Past Medical History:  Diagnosis Date   A-fib (HCC)    Diabetes mellitus without complication (HCC)    High cholesterol    Hypertension     History reviewed. No pertinent surgical history.   Home Medications:  Prior to Admission medications   Medication Sig Start Date End Date Taking? Authorizing Provider  amLODipine  (NORVASC ) 5 MG tablet Take 5 mg by mouth daily.  07/14/23  Yes [provider]  apixaban  (ELIQUIS ) 5 MG TABS tablet Take 5 mg by mouth 2 (two) times daily.   Yes [provider]  calcium  carbonate (OSCAL) 1500 (600 Ca) MG TABS tablet Take 1,500 mg by mouth daily in the afternoon.   Yes [provider]  cholecalciferol  (VITAMIN D ) 1000 units tablet Take 1,000 Units by mouth daily in the afternoon.   Yes [provider]  esomeprazole (NEXIUM) 40 MG capsule Take 40 mg by mouth 2 (two) times daily. 07/14/23  Yes [provider]  losartan (COZAAR) 100 MG tablet Take 100 mg by mouth daily. 08/10/23  Yes [provider]  metFORMIN (GLUCOPHAGE-XR) 500 MG 24 hr tablet Take 500 mg by mouth every morning. 07/09/23  Yes [provider]  pravastatin  (PRAVACHOL ) 80 MG tablet Take 80 mg by mouth daily.   Yes [provider]  sotalol (BETAPACE) 80 MG tablet Take 40 mg by mouth 2 (two) times daily.   Yes [provider]    Scheduled Meds:  amLODipine   5 mg Oral Daily   apixaban   5 mg Oral BID   calcium  carbonate  1,250 mg Oral Q1500   cholecalciferol   1,000 Units Oral Q1500   pantoprazole   40 mg Oral Daily   pravastatin   80 mg Oral Daily   Continuous Infusions:  PRN Meds: acetaminophen , ondansetron  (ZOFRAN ) IV  Allergies:    Allergies  Allergen Reactions   Amoxicillin-Pot Clavulanate Nausea Only and Other (See Comments)  unknown   Levofloxacin Other (See Comments)    Flushing (ALLERGY/intolerance); diarrhea   Prednisone Other (See Comments)    Has been told, due to her heart condition, not to take unless absolutely necessary-pt denies 04/23/2023, does not remember ever being told this   Aspirin Nausea Only   Lisinopril Cough    Social History:   Social History   Socioeconomic History   Marital status: Married    Spouse name: Not on file   Number of children: Not on file   Years of education: Not on file   Highest education level: Not on file  Occupational  History   Not on file  Tobacco Use   Smoking status: Never   Smokeless tobacco: Never  Substance and Sexual Activity   Alcohol use: No   Drug use: No   Sexual activity: Not on file  Other Topics Concern   Not on file  Social History Narrative   Not on file   Social Drivers of Health   Financial Resource Strain: Not on file  Food Insecurity: Low Risk  (02/28/2023)   Received from [facility]   Hunger Vital Sign    Worried About Running Out of Food in the Last Year: Never true    Ran Out of Food in the Last Year: Never true  Transportation Needs: No Transportation Needs (02/28/2023)   Received from [facility]    In the past 12 months, has lack of reliable transportation kept you from medical appointments, meetings, work or from getting things needed for daily living? : No  Physical Activity: Not on file  Stress: Not on file  Social Connections: Not on file  Intimate Partner Violence: Not on file    Family History:   Family History  Problem Relation Age of Onset   Healthy Mother    Healthy Father      ROS:  Please see the history of present illness.   All other ROS reviewed and negative.     Physical Exam/Data: Vitals:   09/07/23 0900 09/07/23 1000 09/07/23 1115 09/07/23 1200  BP: (!) 150/54 (!) 130/54 (!) 111/50   Pulse: 70 (!) 55 73   Resp: (!) 23 18 17    Temp:    98.1 F (36.7 C)  TempSrc:    Oral  SpO2: 98% 97% 100%   Weight:      Height:       No intake or output data in the 24 hours ending 09/07/23 1253    09/06/2023   10:10 PM 09/06/2023    6:35 PM 02/07/2017    2:11 AM  Last 3 Weights  Weight (lbs) 135 lb 135 lb 135 lb  Weight (kg) 61.236 kg 61.236 kg 61.236 kg     Body mass index is 26.37 kg/m.  General:  Well nourished, well developed, in no acute distress HEENT: normal Neck: no JVD Vascular: No carotid bruits; Distal pulses 2+ bilaterally Cardiac:  Irregularly regular rhythm ,normal rate ,no murmurs appreciated   Lungs:  clear to auscultation bilaterally, no wheezing, rhonchi or rales  Abd: soft, nontender, no hepatomegaly  Ext: no edema Musculoskeletal:  No deformities, BUE and BLE strength normal and equal Skin: warm and dry  Neuro:  CNs 2-12 intact, no focal abnormalities noted Psych:  Normal affect ,oriented to time , place and person   EKG:  The EKG was personally reviewed and demonstrates:  Afib ,normal rate   Telemetry:  Telemetry was personally reviewed and demonstrates:  Afib   Relevant CV Studies: Trops are normal BNP WNL PE excluded on CTA but concerning for infection and malignancy cannot be ruled out .  CT chest in March  2025 showed chronic multiple pulmonary nodules  Mobitz type 2 second degree block noted in the ED   Laboratory Data: High Sensitivity Troponin:  No results for input(s): "TROPONINIHS" in the last 720 hours.   Chemistry Recent Labs  Lab 09/06/23 1837  NA 133*  K 4.4  CL 97*  CO2 19*  GLUCOSE 162*  BUN 15  CREATININE 0.88  CALCIUM 9.2  GFRNONAA >60  ANIONGAP 17*    No results for input(s): "PROT", "ALBUMIN", "AST", "ALT", "ALKPHOS", "BILITOT" in the last 168 hours. Lipids No results for input(s): "CHOL", "TRIG", "HDL", "LABVLDL", "LDLCALC", "CHOLHDL" in the last 168 hours.  Hematology Recent Labs  Lab 09/06/23 1837  WBC 7.5  RBC 4.09  HGB 11.3*  HCT 33.2*  MCV 81.2  MCH 27.6  MCHC 34.0  RDW 13.1  PLT 363   Thyroid No results for input(s): "TSH", "FREET4" in the last 168 hours.  BNP Recent Labs  Lab 09/06/23 1837  PROBNP 84.3    DDimer No results for input(s): "DDIMER" in the last 168 hours.  Radiology/Studies:  CT Angio Chest PE W and/or Wo Contrast Result Date: 09/06/2023 CLINICAL DATA:  Syncope/presyncope, cerebrovascular cause suspected. Chest pain and shortness of breath. EXAM: CT ANGIOGRAPHY CHEST WITH CONTRAST TECHNIQUE: Multidetector CT imaging of the chest was performed using the standard protocol during bolus administration  of intravenous contrast. Multiplanar CT image reconstructions and MIPs were obtained to evaluate the vascular anatomy. RADIATION DOSE REDUCTION: This exam was performed according to the departmental dose-optimization program which includes automated exposure control, adjustment of the mA and/or kV according to patient size and/or use of iterative reconstruction technique. CONTRAST:  75mL OMNIPAQUE IOHEXOL 350 MG/ML SOLN COMPARISON:  Same day chest radiograph and CTA chest 12/06/2007 FINDINGS: Cardiovascular: Negative for acute pulmonary embolism. No pericardial effusion. Coronary artery and aortic atherosclerotic calcification. Normal caliber thoracic aorta. No dissection. Mediastinum/Nodes: Esophagus is unremarkable. Patent trachea. Normal thyroid. No thoracic adenopathy. Lungs/Pleura: Centrilobular micro nodules in the right upper lobe compatible with small airway infection/inflammation. 10 x 8 mm cavitary nodule in the right apex (see series 13/image 15). The high bilateral apices are not included in the exam. No pleural effusion or pneumothorax. Upper Abdomen: Cholelithiasis. No evidence of acute cholecystitis. No acute abnormality. Musculoskeletal: No acute fracture. Review of the MIP images confirms the above findings. IMPRESSION: 1. Negative for acute pulmonary embolism. 2. 10 x 8 mm cavitary nodule in the right apex. This may be infectious/inflammatory however malignancy is not excluded. Consider one of the following in 3 months for both low-risk and high-risk individuals: (a) repeat chest CT, (b) follow-up PET-CT, or (c) tissue sampling. This recommendation follows the consensus statement: Guidelines for Management of Incidental Pulmonary Nodules Detected on CT Images: From the Fleischner Society 2017; Radiology 2017; 284:228-243. 3. Centrilobular micro nodules in the right upper lobe compatible with small airway infection/inflammation. 4. Aortic Atherosclerosis (ICD10-I70.0). Electronically Signed   By:  Rozell Cornet M.D.   On: 09/06/2023 23:55   DG Chest 2 View Result Date: 09/06/2023 CLINICAL DATA:  Chest pain, shortness of breath, mild cough EXAM: CHEST - 2 VIEW COMPARISON:  08/09/2022 FINDINGS: Right chest wall Port-A-Cath with tip at the superior cavoatrial junction. Stable cardiomediastinal silhouette. Aortic atherosclerotic calcification. No focal consolidation, pleural effusion, or pneumothorax. No displaced rib fractures. IMPRESSION: No  active cardiopulmonary disease. Electronically Signed   By: Rozell Cornet M.D.   On: 09/06/2023 20:18     Assessment and Plan: Mobitz type 2 second degree block  Chest Pain/ SOB Asymptomatic Afib Pulmonary nodules on Chest CT  DM HTN HLD   Plan  - For her chest pain and shortness of breath, I don't think this is  ACS based on labs and presentation but can't be totally ruled out. Low concerns for  PE,also low concerns for coronary vasospasm ,pneumothorax or dissection. In the setting of her HTN,DM, and HLD, this could be a CAD ,new onset HF or regional wall motion abnormality from a past MI . I agree with getting an ECHO . If unrevealing, would consider coronary CT to further assess. - For her Afib,will continue her home Eliquis for now. She is asymptomatic - For her HTN, Will continue her home Amlodipine. Vitals reviewed  - For her HLD, resume Pravastatin  - For DM, HgA1c of 6.2 in 06/2023, well controlled,daily CBG, add SSI when needed  - For her pulmonary nodules, I wonder if this contributing to her SOB, may  from outpatient pulmonary follow up    Risk Assessment/Risk Scores:         CHA2DS2-VASc Score =     This indicates a  % annual risk of stroke. The patient's score is based upon:          For questions or updates, please contact [facility] Please consult [email or web address] for contact info under    Signed, Fay Hoop, MD  09/07/2023 12:53 PM   I have personally seen and examined this patient. I agree with the  assessment and plan as outlined above.  83 yo female with history of HTN, HLD, DM and atrial fib on Eliquis admitted with atypical chest pain. Chest CTA negative for PE or pneumonia. Echo with normal LV function. No WMA. Mild AI. Mild to mod MR.  Troponin negative x 2 Labs reviewed by me EKG with atrial fib, rate controlled. Reviewed by me My exam: NAD  CV: irreg irreg LUngs: clear bilat  Ext: No LE edema Plan: Atypical chest pain: No ischemic EKG findings. Troponin negative. Echo with normal LV function. NO pericardial effusion. No further cardiac workup. Follow up in [facility] with [facility]. Consider outpatient stress testing or outpatient cardiac CTA.  Transient second degree AV block, type 2. NO dizziness. No further workup.   Antoinette Batman, MD, [facility] 09/07/2023 2:17 PM

## 2023-09-07 NOTE — Care Management Obs Status (Signed)
 MEDICARE OBSERVATION STATUS NOTIFICATION   Patient Details  Name: Savannah Kramer MRN: [id number] Date of Birth: November 22, 1940   Medicare Observation Status Notification Given:  Yes    Janith Melnick 09/07/2023, 1:19 PM

## 2023-09-07 NOTE — Discharge Summary (Signed)
 Physician Discharge Summary   Patient: Savannah Kramer MRN: [id number] DOB: Jan 14, 1941  Admit date:     09/06/2023  Discharge date: 09/07/23  Discharge Physician: Aura Leeds, DO   PCP: Lieutenant Reese., MD   Recommendations at discharge:   Follow-up with PCP within 1 to 2 weeks repeat CBC, CMP, mag, Phos within 1 week Follow-up with [facility]ology in [facility] within 1 to 2 weeks. Follow-up with medical oncology within 1 to 2 weeks Follow-up with pulmonary in outpatient setting for further evaluation of lung nodules and cavitary lung nodule noted.  Discharge Diagnoses: Principal Problem:   Arrhythmia  Resolved Problems:   * No resolved hospital problems. *  Hospital Course: Savannah Kramer is a 83 y.o. 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  Patient noted that she started having progressive worsening chest discomfort and shortness of breath about 4 days ago.  She states that the chest pain felt like a pressure and a heaviness and was located centrally without radiation.  Given her chest discomfort it caused the patient to have significant and is likely symptoms including and reporting dizziness.  Prior to this she had diarrhea probably in the evenings for the last 3 days and some nausea but this is resolved.  She has a history of acid reflux but she felt that these episodes were different so she went to go and see her PCP who recommended her come to the ED for further evaluation.  She admitted having a slight cough but no fevers, sore throat, chills or bodyaches or any urinary or GU symptoms.  Given her  symptoms of intermittent chest discomfort and shortness of breath she presented to the ED and underwent workup.  Cardiology was consulted for further evaluation recommendations and of note she was noted to have an intermittent transient type II block.  By the time I evaluated the patient her symptoms are completely resolved and she felt closer to her baseline.   Cardiology evaluated her and echocardiogram was done.  Echocardiogram formal read is still pending but it was reviewed by the cardiologist who evaluated the patient and he felt that her LV function was normal and she had no regional wall motion abnormalities.  Cardiology cleared her from their standpoint and recommended outpatient coronary CTA versus a outpatient stress test.  Prior to discharge she ambulated and did not desaturate.  Pulmonary was contacted for further discussion about her cavitary lung nodules and they are recommending outpatient follow-up.  She is medically stable for discharge at this time as her symptoms are resolved and she will need to follow-up with PCP, cardiology, pulmonology, medical oncology in outpatient setting within 1 to 2 weeks  Assessment and Plan: Chest Pain and Dyspnea Rule out ACS: Troponins negative at less than 15 x 2.  Symptoms have resolved completely.  Cardiology was consulted and they do not believe that this is ACS based on her labs and for chest pain.  There is low concern for coronary vasospasm and recommended getting an echocardiogram.  Echocardiogram was reviewed by Dr. Abel Hoe who saw the had normal LV function with no pericardial effusion or regional wall motion abnormalities.  Patient did have mild to moderate MR and  mild aortic insufficiency.  EKG showed no ischemic findings.  Cardiology has signed off the case and recommending follow-up in the outpatient setting with [facility] where she goes and recommending outpatient CTA or outpatient stress testing.  Patient ambulated without  issues and did not desaturate.  She will need to follow-up with PCP, cardiology within 1 week as she is medically stable and cleared by Cardiology    Transient second-degree AV block type II: She denies any dizziness now and telemetry was reviewed by cardiology and recommended no further workup at this time.  Current recommendation is to still continue beta-blocker at this time and so she was resumed on her sotalol at discharge   Atrial Fibrillation: Rate controlled.  Resume home sotalol continue apixaban anticoagulation   Cavitary Lung Nodule/Pulmonary Nodules: Obtain Sputum AFB but not really expectorating Sputum. CTA PE Protocol noted a 10 x 8 mm cavitary nodule in the right apex which was considered infectious or inflammatory however membranes is not excluded.  Current recommendations for her to follow-up in 3 months for repeat chest CT or a follow-up PET CT scan or tissue sampling.  Discussed with pulmonary and they will follow-up in outpatient setting and arranging follow-up in the clinic.  Notably there is also centrilobular micronodules in the right upper lobe compatible with small airway infection or inflammation.  Currently she is asymptomatic and not dyspneic, febrile or having a leukocytosis.  Will need close follow-up; of note is the CT scan done at [facility] shows that she had some improvement in the lung nodule that she had previously..   Essential HTN: C/w Amlodipine 5 mg po Daily; Held Losartan while hospitalized but can resume @ D/C   HLD: C/w Pravastatin 80 mg po Daily   Hyponatremia: Mild. Na+ is now 133. CTM and Trend in the outpatient setting w/in 1 week   Elevated AG Metabolic Acidosis: Mild.  The O2 was 97, anion gap was 17, chloride low at 97.  Continue monitor trend and repeat CMP within 1 week   Diabetes Mellitus Type 2 w/o Complication: c/w Home Metformin 500 mg po BID @ D/C. Blood Sugar elevated at 162 on BMP.  Further care per PCP in the outpt setting   Normocytic  Anemia: Hgb/Hct is now 11.3/33.2.  Check anemia panel in outpatient setting.  Continue to monitor for signs and symptoms bleeding; no overt bleeding noted.  Repeat CBC within 1 week   Hx of Urotheilial Bladder Carcinoma w/ Muscle Invasion: She is status post transurethral resection of bladder tumor followed by chemotherapy and radiation.  Will need to follow-up with medical oncology in outpatient setting and further workup for her pulmonary nodules as above   GERD/GI Prophylaxis: C/w PPI w/ Pantoprazole 40 mg po Daily hospitalized and then resume home Esomeprazole 40 mg p.o. daily   Overweight: Complicates overall prognosis and care. Estimated body mass index is 26.37 kg/m as calculated from the following:   Height as of this encounter: 5' (1.524 m).   Weight as of this encounter: 61.2 kg. Weight Loss and Dietary Counseling given   Consultants: Cardiology; D/w Pulmonary Procedures performed: ECHOCardiogram Disposition: Home Diet recommendation:  Discharge Diet Orders (From admission, onward)     Start     Ordered   09/07/23 0000  Diet - low sodium heart healthy        09/07/23 1445           Cardiac and Carb modified diet DISCHARGE MEDICATION: Allergies as of 09/07/2023  Reactions   Amoxicillin-pot Clavulanate Nausea Only, Other (See Comments)   unknown   Levofloxacin Other (See Comments)   Flushing (ALLERGY/intolerance); diarrhea   Prednisone Other (See Comments)   Has been told, due to her heart condition, not to take unless absolutely necessary-pt denies 04/23/2023, does not remember ever being told this   Aspirin Nausea Only   Lisinopril Cough        Medication List     TAKE these medications    acetaminophen  325 MG tablet Commonly known as: TYLENOL  Take 2 tablets (650 mg total) by mouth every 4 (four) hours as needed for headache or mild pain (pain score 1-3).   amLODipine  5 MG tablet Commonly known as: NORVASC  Take 5 mg by mouth daily.   calcium   carbonate 1500 (600 Ca) MG Tabs tablet Commonly known as: OSCAL Take 1,500 mg by mouth daily in the afternoon.   cholecalciferol  1000 units tablet Commonly known as: VITAMIN D  Take 1,000 Units by mouth daily in the afternoon.   Eliquis  5 MG Tabs tablet Generic drug: apixaban  Take 5 mg by mouth 2 (two) times daily.   esomeprazole 40 MG capsule Commonly known as: NEXIUM Take 40 mg by mouth 2 (two) times daily.   losartan 100 MG tablet Commonly known as: COZAAR Take 100 mg by mouth daily.   metFORMIN 500 MG 24 hr tablet Commonly known as: GLUCOPHAGE-XR Take 500 mg by mouth every morning.   pravastatin  80 MG tablet Commonly known as: PRAVACHOL  Take 80 mg by mouth daily.   sotalol 80 MG tablet Commonly known as: BETAPACE Take 40 mg by mouth 2 (two) times daily.        Follow-up Information     Velazquez, Ardean Beat., MD Follow up.   Specialty: Internal Medicine Contact information: [address] [place] [address] [phone]         [facility], [place] Follow up.   Why: Cardiology as needed, If symptoms worsen Contact information: [address] [place] [address] [phone]                Discharge Exam: Cleavon Curls Weights   09/06/23 1835 09/06/23 2210  Weight: 61.2 kg 61.2 kg   Vitals:   09/07/23 1115 09/07/23 1200  BP: (!) 111/50   Pulse: 73   Resp: 17   Temp:  98.1 F (36.7 C)  SpO2: 100%    SAME DAY PHYSICAL Examination: Physical Exam:   Constitutional: WN/WD overweight elderly Hispanic female in no acute distress appears calm Respiratory: Mildly diminished to auscultation bilaterally with some coarse breath sounds, no wheezing, rales, rhonchi or crackles. Normal respiratory effort and patient is not tachypenic. No accessory muscle use.  Unlabored breathing Cardiovascular: RRR, no murmurs / rubs / gallops. S1 and S2 auscultated. No extremity edema.   Abdomen: Soft, non-tender, slightly distended secondary to  body habitus. Bowel sounds positive.  GU: Deferred. Musculoskeletal: No clubbing / cyanosis of digits/nails. No joint deformity upper and lower extremities. Skin: No rashes, lesions, ulcers on limited skin evaluation. No induration; Warm and dry.  Neurologic: CN 2-12 grossly intact with no focal deficits. Romberg sign and cerebellar reflexes not assessed.  Psychiatric: Normal judgment and insight. Alert and oriented x 3. Normal mood and appropriate affect.   Condition at discharge: stable  The results of significant diagnostics from this hospitalization (including imaging, microbiology, ancillary and laboratory) are listed below for reference.   Imaging Studies: CT Angio Chest PE W and/or Wo Contrast  Result Date: 09/06/2023 CLINICAL DATA:  Syncope/presyncope, cerebrovascular cause suspected. Chest pain and shortness of breath. EXAM: CT ANGIOGRAPHY CHEST WITH CONTRAST TECHNIQUE: Multidetector CT imaging of the chest was performed using the standard protocol during bolus administration of intravenous contrast. Multiplanar CT image reconstructions and MIPs were obtained to evaluate the vascular anatomy. RADIATION DOSE REDUCTION: This exam was performed according to the departmental dose-optimization program which includes automated exposure control, adjustment of the mA and/or kV according to patient size and/or use of iterative reconstruction technique. CONTRAST:  75mL OMNIPAQUE IOHEXOL 350 MG/ML SOLN COMPARISON:  Same day chest radiograph and CTA chest 12/06/2007 FINDINGS: Cardiovascular: Negative for acute pulmonary embolism. No pericardial effusion. Coronary artery and aortic atherosclerotic calcification. Normal caliber thoracic aorta. No dissection. Mediastinum/Nodes: Esophagus is unremarkable. Patent trachea. Normal thyroid. No thoracic adenopathy. Lungs/Pleura: Centrilobular micro nodules in the right upper lobe compatible with small airway infection/inflammation. 10 x 8 mm cavitary nodule in the  right apex (see series 13/image 15). The high bilateral apices are not included in the exam. No pleural effusion or pneumothorax. Upper Abdomen: Cholelithiasis. No evidence of acute cholecystitis. No acute abnormality. Musculoskeletal: No acute fracture. Review of the MIP images confirms the above findings. IMPRESSION: 1. Negative for acute pulmonary embolism. 2. 10 x 8 mm cavitary nodule in the right apex. This may be infectious/inflammatory however malignancy is not excluded. Consider one of the following in 3 months for both low-risk and high-risk individuals: (a) repeat chest CT, (b) follow-up PET-CT, or (c) tissue sampling. This recommendation follows the consensus statement: Guidelines for Management of Incidental Pulmonary Nodules Detected on CT Images: From the Fleischner Society 2017; Radiology 2017; 284:228-243. 3. Centrilobular micro nodules in the right upper lobe compatible with small airway infection/inflammation. 4. Aortic Atherosclerosis (ICD10-I70.0). Electronically Signed   By: Rozell Cornet M.D.   On: 09/06/2023 23:55   DG Chest 2 View Result Date: 09/06/2023 CLINICAL DATA:  Chest pain, shortness of breath, mild cough EXAM: CHEST - 2 VIEW COMPARISON:  08/09/2022 FINDINGS: Right chest wall Port-A-Cath with tip at the superior cavoatrial junction. Stable cardiomediastinal silhouette. Aortic atherosclerotic calcification. No focal consolidation, pleural effusion, or pneumothorax. No displaced rib fractures. IMPRESSION: No active cardiopulmonary disease. Electronically Signed   By: Rozell Cornet M.D.   On: 09/06/2023 20:18   Microbiology: Results for orders placed or performed during the hospital encounter of 09/06/23  Resp panel by RT-PCR (RSV, Flu A&B, Covid) Anterior Nasal Swab     Status: None   Collection Time: 09/06/23  9:31 PM   Specimen: Anterior Nasal Swab  Result Value Ref Range Status   SARS Coronavirus 2 by RT PCR NEGATIVE NEGATIVE Final    Comment: (NOTE) SARS-CoV-2  target nucleic acids are NOT DETECTED.  The SARS-CoV-2 RNA is generally detectable in upper respiratory specimens during the acute phase of infection. The lowest concentration of SARS-CoV-2 viral copies this assay can detect is 138 copies/mL. A negative result does not preclude SARS-Cov-2 infection and should not be used as the sole basis for treatment or other patient management decisions. A negative result may occur with  improper specimen collection/handling, submission of specimen other than nasopharyngeal swab, presence of viral mutation(s) within the areas targeted by this assay, and inadequate number of viral copies(<138 copies/mL). A negative result must be combined with clinical observations, patient history, and epidemiological information. The expected result is Negative.  Fact Sheet for Patients:  BloggerCourse.com  Fact Sheet for Healthcare Providers:  SeriousBroker.it  This test is no t yet approved  or cleared by the [place]  FDA and  has been authorized for detection and/or diagnosis of SARS-CoV-2 by FDA under an Emergency Use Authorization (EUA). This EUA will remain  in effect (meaning this test can be used) for the duration of the COVID-19 declaration under Section 564(b)(1) of the Act, 21 U.S.C.section 360bbb-3(b)(1), unless the authorization is terminated  or revoked sooner.       Influenza A by PCR NEGATIVE NEGATIVE Final   Influenza B by PCR NEGATIVE NEGATIVE Final    Comment: (NOTE) The Xpert Xpress SARS-CoV-2/FLU/RSV plus assay is intended as an aid in the diagnosis of influenza from Nasopharyngeal swab specimens and should not be used as a sole basis for treatment. Nasal washings and aspirates are unacceptable for Xpert Xpress SARS-CoV-2/FLU/RSV testing.  Fact Sheet for Patients: BloggerCourse.com  Fact Sheet for Healthcare  Providers: SeriousBroker.it  This test is not yet approved or cleared by the [place]  FDA and has been authorized for detection and/or diagnosis of SARS-CoV-2 by FDA under an Emergency Use Authorization (EUA). This EUA will remain in effect (meaning this test can be used) for the duration of the COVID-19 declaration under Section 564(b)(1) of the Act, 21 U.S.C. section 360bbb-3(b)(1), unless the authorization is terminated or revoked.     Resp Syncytial Virus by PCR NEGATIVE NEGATIVE Final    Comment: (NOTE) Fact Sheet for Patients: BloggerCourse.com  Fact Sheet for Healthcare Providers: SeriousBroker.it  This test is not yet approved or cleared by the [place]  FDA and has been authorized for detection and/or diagnosis of SARS-CoV-2 by FDA under an Emergency Use Authorization (EUA). This EUA will remain in effect (meaning this test can be used) for the duration of the COVID-19 declaration under Section 564(b)(1) of the Act, 21 U.S.C. section 360bbb-3(b)(1), unless the authorization is terminated or revoked.  Performed at [facility], [address] [place], [place] [address]    Labs: CBC: Recent Labs  Lab 09/06/23 1837  WBC 7.5  HGB 11.3*  HCT 33.2*  MCV 81.2  PLT 363   Basic Metabolic Panel: Recent Labs  Lab 09/06/23 1837  NA 133*  K 4.4  CL 97*  CO2 19*  GLUCOSE 162*  BUN 15  CREATININE 0.88  CALCIUM 9.2   Liver Function Tests: No results for input(s): "AST", "ALT", "ALKPHOS", "BILITOT", "PROT", "ALBUMIN" in the last 168 hours. CBG: No results for input(s): "GLUCAP" in the last 168 hours.  Discharge time spent: less than 30 minutes.  Signed: Aura Leeds, DO [facility] 09/07/2023

## 2023-09-07 NOTE — ED Notes (Signed)
 Report given to Megan, [facility] charge RN.   Anastacio Balm, RN

## 2023-09-07 NOTE — Progress Notes (Signed)
 Plan of Care Note for accepted transfer   Patient: Savannah Kramer MRN: [id number]   DOA: 09/06/2023  Facility requesting transfer: [facility]   Requesting Provider: Dr. Maralee Senate   Reason for transfer: arrhythmia   Facility course: 83 yr old female with hx HTN, HLD, DM, bladder cancer, and A fib on Eliquis who presents with 4 days of SOB and intermittent chest pain.   BNP and troponin are normal. CTA negative for PE but concerning for possible infection though malignancy not excluded. She was noted to go into Mobitz type 2 second degree block in ED.   Cardiology fellow will consult and recommends medical admission to [facility] and echocardiogram.   Plan of care: The patient is accepted for admission to Progressive unit, at [facility].   Author: Walton Guppy, MD 09/07/2023  Check [email or web address] for on-call coverage.  Nursing staff, Please call [facility] Admits & Consults System-Wide number on Amion as soon as patient's arrival, so appropriate admitting provider can evaluate the pt.

## 2023-09-10 NOTE — Telephone Encounter (Signed)
 Patient has been scheduled

## 2023-11-09 ENCOUNTER — Ambulatory Visit: Admitting: Pulmonary Disease
